# Patient Record
Sex: Female | Born: 1975 | Race: White | Hispanic: No | Marital: Married | Smoking: Former smoker
Health system: Southern US, Community
[De-identification: ages and names within clinical notes are randomized; demographics above are authoritative.]

## PROBLEM LIST (undated history)

## (undated) DIAGNOSIS — M1990 Unspecified osteoarthritis, unspecified site: Secondary | ICD-10-CM

## (undated) DIAGNOSIS — T783XXA Angioneurotic edema, initial encounter: Secondary | ICD-10-CM

## (undated) DIAGNOSIS — K219 Gastro-esophageal reflux disease without esophagitis: Secondary | ICD-10-CM

## (undated) DIAGNOSIS — B009 Herpesviral infection, unspecified: Secondary | ICD-10-CM

## (undated) HISTORY — PX: TUBAL LIGATION: SHX77

## (undated) HISTORY — DX: Angioneurotic edema, initial encounter: T78.3XXA

## (undated) HISTORY — PX: WISDOM TOOTH EXTRACTION: SHX21

---

## 1998-09-23 ENCOUNTER — Other Ambulatory Visit: Admission: RE | Admit: 1998-09-23 | Discharge: 1998-09-23 | Payer: Self-pay | Admitting: Obstetrics and Gynecology

## 2000-05-07 ENCOUNTER — Other Ambulatory Visit: Admission: RE | Admit: 2000-05-07 | Discharge: 2000-05-07 | Payer: Self-pay | Admitting: Obstetrics and Gynecology

## 2000-10-02 ENCOUNTER — Encounter (INDEPENDENT_AMBULATORY_CARE_PROVIDER_SITE_OTHER): Payer: Self-pay | Admitting: Specialist

## 2000-10-02 ENCOUNTER — Ambulatory Visit (HOSPITAL_COMMUNITY): Admission: RE | Admit: 2000-10-02 | Discharge: 2000-10-02 | Payer: Self-pay | Admitting: Gastroenterology

## 2002-03-05 ENCOUNTER — Emergency Department (HOSPITAL_COMMUNITY): Admission: EM | Admit: 2002-03-05 | Discharge: 2002-03-05 | Payer: Self-pay | Admitting: Emergency Medicine

## 2002-08-11 ENCOUNTER — Other Ambulatory Visit: Admission: RE | Admit: 2002-08-11 | Discharge: 2002-08-11 | Payer: Self-pay | Admitting: Obstetrics and Gynecology

## 2003-09-02 ENCOUNTER — Other Ambulatory Visit: Admission: RE | Admit: 2003-09-02 | Discharge: 2003-09-02 | Payer: Self-pay | Admitting: Obstetrics and Gynecology

## 2003-09-24 ENCOUNTER — Encounter: Payer: Self-pay | Admitting: Obstetrics and Gynecology

## 2003-09-24 ENCOUNTER — Ambulatory Visit (HOSPITAL_COMMUNITY): Admission: RE | Admit: 2003-09-24 | Discharge: 2003-09-24 | Payer: Self-pay | Admitting: Obstetrics and Gynecology

## 2004-03-01 ENCOUNTER — Inpatient Hospital Stay (HOSPITAL_COMMUNITY): Admission: AD | Admit: 2004-03-01 | Discharge: 2004-03-04 | Payer: Self-pay | Admitting: Obstetrics and Gynecology

## 2004-03-03 ENCOUNTER — Encounter (INDEPENDENT_AMBULATORY_CARE_PROVIDER_SITE_OTHER): Payer: Self-pay | Admitting: *Deleted

## 2004-05-02 ENCOUNTER — Encounter: Admission: RE | Admit: 2004-05-02 | Discharge: 2004-06-01 | Payer: Self-pay | Admitting: Obstetrics and Gynecology

## 2005-09-24 ENCOUNTER — Other Ambulatory Visit: Admission: RE | Admit: 2005-09-24 | Discharge: 2005-09-24 | Payer: Self-pay | Admitting: Obstetrics and Gynecology

## 2006-01-30 ENCOUNTER — Ambulatory Visit: Payer: Self-pay | Admitting: Pulmonary Disease

## 2006-05-08 ENCOUNTER — Ambulatory Visit: Payer: Self-pay | Admitting: Pulmonary Disease

## 2006-05-31 ENCOUNTER — Ambulatory Visit: Payer: Self-pay | Admitting: Internal Medicine

## 2006-12-06 ENCOUNTER — Ambulatory Visit: Payer: Self-pay | Admitting: Pulmonary Disease

## 2007-11-10 ENCOUNTER — Ambulatory Visit: Payer: Self-pay | Admitting: Pulmonary Disease

## 2007-12-09 DIAGNOSIS — F320 Major depressive disorder, single episode, mild: Secondary | ICD-10-CM

## 2007-12-09 DIAGNOSIS — M549 Dorsalgia, unspecified: Secondary | ICD-10-CM | POA: Insufficient documentation

## 2007-12-09 DIAGNOSIS — K589 Irritable bowel syndrome without diarrhea: Secondary | ICD-10-CM | POA: Insufficient documentation

## 2009-07-01 ENCOUNTER — Ambulatory Visit: Payer: Self-pay | Admitting: Pulmonary Disease

## 2009-07-01 DIAGNOSIS — K5900 Constipation, unspecified: Secondary | ICD-10-CM

## 2009-07-01 DIAGNOSIS — N943 Premenstrual tension syndrome: Secondary | ICD-10-CM | POA: Insufficient documentation

## 2010-08-24 ENCOUNTER — Ambulatory Visit: Payer: Self-pay | Admitting: Pulmonary Disease

## 2010-08-24 ENCOUNTER — Encounter: Payer: Self-pay | Admitting: Pulmonary Disease

## 2010-08-24 LAB — CONVERTED CEMR LAB
ALT: 12 units/L (ref 0–35)
AST: 16 units/L (ref 0–37)
Albumin: 4.1 g/dL (ref 3.5–5.2)
Alkaline Phosphatase: 41 units/L (ref 39–117)
BUN: 9 mg/dL (ref 6–23)
Basophils Absolute: 0 10*3/uL (ref 0.0–0.1)
Basophils Relative: 0.3 % (ref 0.0–3.0)
Bilirubin Urine: NEGATIVE
Bilirubin, Direct: 0.2 mg/dL (ref 0.0–0.3)
CO2: 26 meq/L (ref 19–32)
Calcium: 9.6 mg/dL (ref 8.4–10.5)
Chloride: 103 meq/L (ref 96–112)
Cholesterol: 145 mg/dL (ref 0–200)
Creatinine, Ser: 0.7 mg/dL (ref 0.4–1.2)
Eosinophils Absolute: 0 10*3/uL (ref 0.0–0.7)
Eosinophils Relative: 0.1 % (ref 0.0–5.0)
GFR calc non Af Amer: 105.94 mL/min (ref >60)
Glucose, Bld: 84 mg/dL (ref 70–99)
HCT: 42.2 % (ref 36.0–46.0)
HDL: 45.2 mg/dL (ref >39.00)
Hemoglobin, Urine: NEGATIVE
Hemoglobin: 14.4 g/dL (ref 12.0–15.0)
Ketones, ur: 15 mg/dL
LDL Cholesterol: 90 mg/dL (ref 0–99)
Leukocytes, UA: NEGATIVE
Lymphocytes Relative: 16.4 % (ref 12.0–46.0)
Lymphs Abs: 1.5 10*3/uL (ref 0.7–4.0)
MCHC: 34.1 g/dL (ref 30.0–36.0)
MCV: 93.1 fL (ref 78.0–100.0)
Monocytes Absolute: 0.3 10*3/uL (ref 0.1–1.0)
Monocytes Relative: 3.3 % (ref 3.0–12.0)
Neutro Abs: 7.4 10*3/uL (ref 1.4–7.7)
Neutrophils Relative %: 79.9 % — ABNORMAL HIGH (ref 43.0–77.0)
Nitrite: NEGATIVE
Platelets: 265 10*3/uL (ref 150.0–400.0)
Potassium: 4.2 meq/L (ref 3.5–5.1)
RBC: 4.53 M/uL (ref 3.87–5.11)
RDW: 12.4 % (ref 11.5–14.6)
Sodium: 140 meq/L (ref 135–145)
Specific Gravity, Urine: 1.025 (ref 1.000–1.030)
TSH: 1.08 microintl units/mL (ref 0.35–5.50)
Total Bilirubin: 0.9 mg/dL (ref 0.3–1.2)
Total CHOL/HDL Ratio: 3
Total Protein, Urine: NEGATIVE mg/dL
Total Protein: 7.1 g/dL (ref 6.0–8.3)
Triglycerides: 49 mg/dL (ref 0.0–149.0)
Urine Glucose: NEGATIVE mg/dL
Urobilinogen, UA: 1 (ref 0.0–1.0)
VLDL: 9.8 mg/dL (ref 0.0–40.0)
WBC: 9.3 10*3/uL (ref 4.5–10.5)
pH: 5.5 (ref 5.0–8.0)

## 2010-09-05 ENCOUNTER — Telehealth (INDEPENDENT_AMBULATORY_CARE_PROVIDER_SITE_OTHER): Payer: Self-pay | Admitting: *Deleted

## 2010-09-15 ENCOUNTER — Telehealth (INDEPENDENT_AMBULATORY_CARE_PROVIDER_SITE_OTHER): Payer: Self-pay | Admitting: *Deleted

## 2010-10-24 HISTORY — PX: HERNIA REPAIR: SHX51

## 2010-11-09 ENCOUNTER — Ambulatory Visit
Admission: RE | Admit: 2010-11-09 | Discharge: 2010-11-09 | Payer: Self-pay | Source: Home / Self Care | Admitting: General Surgery

## 2011-01-21 LAB — CONVERTED CEMR LAB
ALT: 15 units/L (ref 0–35)
AST: 18 units/L (ref 0–37)
Albumin: 3.2 g/dL — ABNORMAL LOW (ref 3.5–5.2)
Alkaline Phosphatase: 38 units/L — ABNORMAL LOW (ref 39–117)
BUN: 9 mg/dL (ref 6–23)
Basophils Absolute: 0 10*3/uL (ref 0.0–0.1)
Basophils Relative: 0.7 % (ref 0.0–3.0)
Bilirubin Urine: NEGATIVE
Bilirubin, Direct: 0.1 mg/dL (ref 0.0–0.3)
CO2: 28 meq/L (ref 19–32)
Calcium: 9.3 mg/dL (ref 8.4–10.5)
Chloride: 106 meq/L (ref 96–112)
Cholesterol: 156 mg/dL (ref 0–200)
Creatinine, Ser: 0.8 mg/dL (ref 0.4–1.2)
Eosinophils Absolute: 0 10*3/uL (ref 0.0–0.7)
Eosinophils Relative: 0.9 % (ref 0.0–5.0)
GFR calc non Af Amer: 86.91 mL/min (ref >60)
Glucose, Bld: 90 mg/dL (ref 70–99)
HCT: 40.6 % (ref 36.0–46.0)
HDL: 52.3 mg/dL (ref >39.00)
Hemoglobin, Urine: NEGATIVE
Hemoglobin: 14.1 g/dL (ref 12.0–15.0)
Ketones, ur: NEGATIVE mg/dL
LDL Cholesterol: 84 mg/dL (ref 0–99)
Leukocytes, UA: NEGATIVE
Lymphocytes Relative: 29.2 % (ref 12.0–46.0)
Lymphs Abs: 1.5 10*3/uL (ref 0.7–4.0)
MCHC: 34.7 g/dL (ref 30.0–36.0)
MCV: 90.9 fL (ref 78.0–100.0)
Monocytes Absolute: 0.4 10*3/uL (ref 0.1–1.0)
Monocytes Relative: 7.3 % (ref 3.0–12.0)
Neutro Abs: 3.2 10*3/uL (ref 1.4–7.7)
Neutrophils Relative %: 61.9 % (ref 43.0–77.0)
Nitrite: NEGATIVE
Platelets: 252 10*3/uL (ref 150.0–400.0)
Potassium: 4.5 meq/L (ref 3.5–5.1)
RBC: 4.47 M/uL (ref 3.87–5.11)
RDW: 11.7 % (ref 11.5–14.6)
Sodium: 139 meq/L (ref 135–145)
Specific Gravity, Urine: 1.025 (ref 1.000–1.030)
TSH: 1.16 microintl units/mL (ref 0.35–5.50)
Total Bilirubin: 0.8 mg/dL (ref 0.3–1.2)
Total CHOL/HDL Ratio: 3
Total Protein, Urine: NEGATIVE mg/dL
Total Protein: 6.6 g/dL (ref 6.0–8.3)
Triglycerides: 101 mg/dL (ref 0.0–149.0)
Urine Glucose: NEGATIVE mg/dL
Urobilinogen, UA: 1 (ref 0.0–1.0)
VLDL: 20.2 mg/dL (ref 0.0–40.0)
WBC: 5.1 10*3/uL (ref 4.5–10.5)
pH: 6 (ref 5.0–8.0)

## 2011-01-25 NOTE — Progress Notes (Signed)
Summary: medication question  Phone Note Call from Patient Call back at Home Phone [phone]   Caller: Patient Call For: nadel Summary of Call: Pt states she is taking zovirax 5% oint for fever blisters, wants to know if there is some type of pill form she can take, re: she gets fever blisters quite often and the ointment "tastes nasty." Pls advise.//[facility] Initial call taken by: Darletta Moll,  September 05, 2010 1:57 PM  Follow-up for Phone Call        Called and spoke with pt.  She states that she would like a substitute for zovirax cream.  She states that she gets fever blisters "all the time" and the cream has a bad taste, would like something in a pill form.  Pls advise, thanks! Follow-up by: Vernie Murders,  September 05, 2010 2:13 PM  Additional Follow-up for Phone Call Additional follow up Details #1::        per SN---ok for pt to have acyclovir 400mg   #100  1 by mouth for times daily as needed for fever blisters  refill as needed.  thanks Randell Loop CMA  September 05, 2010 3:17 PM     Additional Follow-up for Phone Call Additional follow up Details #2::    Spoke with pt and advised her of the above recs per SN.  Pt verbalzied understanding.  Rx was sent to [facility]. Follow-up by: Vernie Murders,  September 05, 2010 3:28 PM  New/Updated Medications: ACYCLOVIR 400 MG TABS (ACYCLOVIR) 1 by mouth four times a day as needed Prescriptions: ACYCLOVIR 400 MG TABS (ACYCLOVIR) 1 by mouth four times a day as needed  #100 x 11   Entered by:   Vernie Murders   Authorized by:   Michele Mcalpine MD   Signed by:   Vernie Murders on 09/05/2010   Method used:   [facility]  [address] [phone][facility]       [address] [place], [place]  [address]       Ph: [phone] or [phone]       Fax: [phone]   RxID:   [id number]

## 2011-01-25 NOTE — Assessment & Plan Note (Signed)
Summary: cpx/jd   CC:  Yearly CPX....  History of Present Illness: 35 y/o WF here for a follow up visit and CPX...    ~  August 24, 2010:  she has been doing well overall but still smoking & "wants to quit"; off Zoloft & wants to try Wellbutrin; still constipated and we discussed laxative/ stool softener Rx; notes freq fever blisters and wants salve...    Current Problems:   Health Maintenance:  we discussed smoking cessation, & rec ASA 81mg /d...  ~  GYN= Dr. Della Goo & she is up to date on PAP etc...  CIGARETTE SMOKER (ICD-305.1) - smokes 1/2 ppd & she's been told to quit by GYN- had to stop the YAZ Rx at age 47...  denies cough, phlegm, SOB, CP, etc...  ~  7/10:  discussed smoking cessation strategies including cessation programs, counselling, nicotine replacement, and Chantix receptor blockade... she declined help.  ~  9/11:  now states that she has decided to quit & want to try Wellbutrin Rx & E-cigs, +rec to consider counselling (she declines), Nicotine relacement, etc...  IRRITABLE BOWEL SYNDROME (ICD-564.1), & CONSTIPATION (ICD-564.00) - Hx IBS w/ constipation predominent form... states BMs once weekly- firm to hard, sometimes w/ liq stool to follow... denies abd pain, N/V, blood seen, etc... prev eval by [name] w/ colonoscopy 2001= neg...  ~  7/10:  we discussed Rx w/ Metamucil once or twice daily, fluids, etc > no better she says.  ~  9/11:  advised to seek help of GI but she declines appt> therefore try combination MIRALAX, SENAKOT-S, & she likes cereal fiber bars...  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 she will discuss further w/ DrHarvett soon...  BACK PAIN, LUMBAR (ICD-724.2) - she uses ETODOLAC 400mg  Bid Prn...  ANXIETY (ICD-300.00), & DEPRESSION (ICD-311) - on ALPRAZOLAM 0.5mg  Tid Prn... she notes that she uses the Xanax mostly around  her menses... prev on Zoloft 50mg /d but she stopped this on her own & wants to try Wellbutrin (Zyban) to help nerves and for smoking cessation help> start BUPROPION 100mg  & titrate up to Bid.  Hx of ANGIONEUROTIC EDEMA NOT ELSEWHERE CLASSIFIED (ICD-995.1) - evaluated in 1997 for urticaria & angioedema of ?etiology... nothing ever determined and symptoms resolved without recurrence... allergy testing by DrESL was neg- IgE level = 11...    Preventive Screening-Counseling & Management  Alcohol-Tobacco     Smoking Status: current     Packs/Day: .50  Allergies (verified): No Known Drug Allergies  Comments:  Nurse/Medical Assistant: The patient's medications and allergies were reviewed with the patient and were updated in the Medication and Allergy Lists.  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 mother alive age 102 father alive age 46---Wayne Vincente Poli 1 sibling alive age 41 grandmother hx of DM  Social History: Reviewed history from 07/01/2009 and no changes required. current smoker--1/2 PPD exercises 2 times per week caffeine use---occ no alcohol use married 2 children Packs/Day:  .50  Review of Systems       The patient complains of depression and anxiety.  The patient denies fever, chills, sweats, anorexia, fatigue, weakness, malaise, weight loss, sleep disorder, blurring, diplopia,  eye irritation, eye discharge, vision loss, eye pain, photophobia, earache, ear discharge, tinnitus, decreased hearing, nasal congestion, nosebleeds, sore throat, hoarseness, chest pain, palpitations, syncope, dyspnea on exertion, orthopnea, PND,  peripheral edema, cough, dyspnea at rest, excessive sputum, hemoptysis, wheezing, pleurisy, nausea, vomiting, diarrhea, constipation, change in bowel habits, abdominal pain, melena, hematochezia, jaundice, gas/bloating, indigestion/heartburn, dysphagia, odynophagia, dysuria, hematuria, urinary frequency, urinary hesitancy, nocturia, incontinence, back pain, joint pain, joint swelling, muscle cramps, muscle weakness, stiffness, arthritis, sciatica, restless legs, leg pain at night, leg pain with exertion, rash, itching, dryness, suspicious lesions, paralysis, paresthesias, seizures, tremors, vertigo, transient blindness, frequent falls, frequent headaches, difficulty walking, memory loss, confusion, cold intolerance, heat intolerance, polydipsia, polyphagia, polyuria, unusual weight change, abnormal bruising, bleeding, enlarged lymph nodes, urticaria, allergic rash, hay fever, and recurrent infections.    Vital Signs:  Patient profile:   35 year old female Height:      65 inches Weight:      162.13 pounds BMI:     27.08 O2 Sat:      98 % on Room air Temp:     98.9 degrees F oral Pulse rate:   76 / minute BP sitting:   126 / 84  (right arm) Cuff size:   regular  Vitals Entered By: Randell Loop CMA (August 24, 2010 11:23 AM)  O2 Sat at Rest %:  98 O2 Flow:  Room air CC: Yearly CPX... Is Patient Diabetic? No Pain Assessment Patient in pain? no      Comments meds updated today with pt   Physical Exam  Additional Exam:  WD, WN, 35 y/o WF in NAD... GENERAL:  Alert & oriented; pleasant & cooperative... HEENT:  /AT, EOM-wnl, PERRLA, Fundi-benign, EACs-clear, TMs-wnl, NOSE-clear, THROAT-clear & wnl. NECK:  Supple w/ full ROM; no JVD; normal carotid impulses w/o bruits; no thyromegaly or nodules palpated; no lymphadenopathy. CHEST:  Clear to P & A; without wheezes/ rales/ or rhonchi. HEART:  Regular Rhythm; without murmurs/ rubs/ or gallops. ABDOMEN:  Soft & nontender; normal bowel  sounds; no organomegaly or masses detected. EXT: without deformities or arthritic changes; no varicose veins/ venous insuffic/ or edema. NEURO:  CN's intact; motor testing normal; sensory testing normal; gait normal & balance OK. DERM:  No lesions noted; no rash etc...    CXR  Procedure date:  08/24/2010  Findings:      CHEST - 2 VIEW Comparison: 07/01/2009.   Findings: The cardiac silhouette is normal size and shape. The lungs are well aerated and free of infiltrates. No pleural abnormality is evident. There is subtle slight scoliosis convexity to the right.   IMPRESSION: No acute or active cardiopulmonary process is identified.   Read By:  Crawford Givens,  M.D.   EKG  Procedure date:  08/24/2010  Findings:      Normal sinus rhythm with rate of:  72/ min... Tracing is WNL, NAD...  SN   MISC. Report  Procedure date:  08/24/2010  Findings:      BMP (METABOL)   Sodium                    140 mEq/L                   135-145   Potassium                 4.2 mEq/L                   3.5-5.1   Chloride  103 mEq/L                   96-112   Carbon Dioxide            26 mEq/L                    19-32   Glucose                   84 mg/dL                    04-54   BUN                       9 mg/dL                     0-98   Creatinine                0.7 mg/dL                   1.1-9.1   Calcium                   9.6 mg/dL                   4.7-82.9   GFR                       105.94 mL/min               >60  Hepatic/Liver Function Panel (HEPATIC)   Total Bilirubin           0.9 mg/dL                   5.6-2.1   Direct Bilirubin          0.2 mg/dL                   3.0-8.6   Alkaline Phosphatase      41 U/L                      39-117   AST                       16 U/L                      0-37   ALT                       12 U/L                      0-35   Total Protein             7.1 g/dL                    5.7-8.4   Albumin                   4.1 g/dL                     6.9-6.2  CBC Platelet w/Diff (CBCD)   White Cell Count          9.3 K/uL                    4.5-10.5  Red Cell Count            4.53 Mil/uL                 3.87-5.11   Hemoglobin                14.4 g/dL                   16.1-09.6   Hematocrit                42.2 %                      36.0-46.0   MCV                       93.1 fl                     78.0-100.0   Platelet Count            265.0 K/uL                  150.0-400.0   Neutrophil %         [H]  79.9 %                      43.0-77.0   Lymphocyte %              16.4 %                      12.0-46.0   Monocyte %                3.3 %                       3.0-12.0   Eosinophils%              0.1 %                       0.0-5.0   Basophils %               0.3 %                       0.0-3.0  Comments:      Lipid Panel (LIPID)   Cholesterol               145 mg/dL                   0-454   Triglycerides             49.0 mg/dL                  0.9-811.9   HDL                       14.78 mg/dL                 >29.56   LDL Cholesterol           90 mg/dL                    2-13  TSH (TSH)   FastTSH                   1.08 uIU/mL  0.35-5.50   UDip w/Micro (URINE)   Color                     YELLOW   Clarity                   CLOUDY                      Clear   Specific Gravity          1.025                       1.000 - 1.030   Urine Ph                  5.5                         5.0-8.0   Protein                   NEGATIVE                    Negative   Urine Glucose             NEGATIVE                    Negative   Ketones                   15                          Negative   Urine Bilirubin           NEGATIVE                    Negative   Blood                     NEGATIVE                    Negative   Urobilinogen              1.0                         0.0 - 1.0   Leukocyte Esterace        NEGATIVE                    Negative   Nitrite                   NEGATIVE                     Negative   Urine WBC                 0-2/hpf                     0-2/hpf   Urine RBC                 0-2/hpf                     0-2/hpf   Urine Mucus               Presence of  None   Urine Epith               Few(5-10/hpf)               Rare(0-4/hpf)   Urine Bacteria            Rare(<10/hpf)               None   Impression & Recommendations:  Problem # 1:  PHYSICAL EXAMINATION (ICD-V70.0)  Orders: T-2 View CXR (71020TC) 12 Lead EKG (12 Lead EKG) TLB-BMP (Basic Metabolic Panel-BMET) (80048-METABOL) TLB-Hepatic/Liver Function Pnl (80076-HEPATIC) TLB-CBC Platelet - w/Differential (85025-CBCD) TLB-Lipid Panel (80061-LIPID) TLB-TSH (Thyroid Stimulating Hormone) (84443-TSH) TLB-Udip w/ Micro (81001-URINE)  Problem # 2:  CIGARETTE SMOKER (ICD-305.1) We discussed smoking cessation strategies>  she wants to try Wellbutrin, +E-cigs... we also discussed nicotine replacement therapy, counseeling, Chantix, etc...  Problem # 3:  CONSTIPATION (ICD-564.00) She has persistant constip going  ~1 per week... we discussed fiber, bulk, softeners, etc... rec trial Miralax Bid + Senakot-S 2Qhs... otherw will need GI f/u for management issues... Her updated medication list for this problem includes:    Miralax Powd (Polyethylene glycol 3350) ..... Mix 1 capful in water two times a day...    Senokot S 8.6-50 Mg Tabs (Sennosides-docusate sodium) .Marland Kitchen... Take 2 tabs by mouth at bedtime...  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 Her updated medication list for this problem includes:    Etodolac 400 Mg Tabs (Etodolac) .Marland Kitchen... Take 1 tab by mouth two times a day as needed for arthritis pain  Problem # 5:  ANXIETY (ICD-300.00) She has anxiety & Depression... wants to switch Zolfoft for Wellbutrin- OK, and refill Alprazolam for her panic attacks... The following medications were removed from the medication list:    Zoloft 50 Mg Tabs (Sertraline hcl)  .Marland Kitchen... Take 1 tablet by mouth once a day Her updated medication list for this problem includes:    Xanax 0.5 Mg Tabs (Alprazolam) .Marland Kitchen... Take 1/2-1 tablet by mouth three times a day as needed for nerves...    Bupropion Hcl 100 Mg Xr12h-tab (Bupropion hcl) .Marland Kitchen... Take 1 tab by mouth two times a day as directed...  Problem # 6:  OTHER MEDICAL ISSUES AS NOTED>>>  Complete Medication List: 1)  Etodolac 400 Mg Tabs (Etodolac) .... Take 1 tab by mouth two times a day as needed for arthritis pain 2)  Miralax Powd (Polyethylene glycol 3350) .... Mix 1 capful in water two times a day... 3)  Senokot S 8.6-50 Mg Tabs (Sennosides-docusate sodium) .... Take 2 tabs by mouth at bedtime.Marland KitchenMarland Kitchen 4)  Xanax 0.5 Mg Tabs (Alprazolam) .... Take 1/2-1 tablet by mouth three times a day as needed for nerves.Marland KitchenMarland Kitchen 5)  Bupropion Hcl 100 Mg Xr12h-tab (Bupropion hcl) .... Take 1 tab by mouth two times a day as directed... 6)  Zovirax 5 % Oint (Acyclovir) .... Apply to lesions every 3-4 h as directed...  Patient Instructions: 1)  Today we updated your med list- see below.... 2)  We wrote new perscriptions for generic Wellbutrin to take once or twice daily as we discussed, and the Zovirax ontment to use as neede dfor the lip sores... 3)  Today we did your follow up CXR, EKG, and FASTING blood work... please call the "phone tree" in a few days for your lab results.Marland KitchenMarland Kitchen 4)  Call for any problems.Marland KitchenMarland Kitchen 5)  Please schedule a follow-up appointment in 1 year, sooner as needed. Prescriptions: ZOVIRAX 5 % OINT (ACYCLOVIR)  apply to lesions every 3-4 H as directed...  #1 tube x prn   Entered and Authorized by:   Michele Mcalpine MD   Signed by:   Michele Mcalpine MD on 08/24/2010   Method used:   Print then Give to Patient   RxID:   [id number] BUPROPION HCL 100 MG XR12H-TAB (BUPROPION HCL) take 1 tab by mouth two times a day as directed...  #60 x 6   Entered and Authorized by:   Michele Mcalpine MD   Signed by:   Michele Mcalpine MD on 08/24/2010    Method used:   Print then Give to Patient   RxID:   [id number] XANAX 0.5 MG TABS (ALPRAZOLAM) Take 1/2-1 tablet by mouth three times a day as needed for nerves...  #90 x 6   Entered and Authorized by:   Michele Mcalpine MD   Signed by:   Michele Mcalpine MD on 08/24/2010   Method used:   Print then Give to Patient   RxID:   [id number]

## 2011-01-25 NOTE — Progress Notes (Signed)
Summary: change from wellbutrin back to zoloft -   Phone Note Call from Patient   Caller: Patient Call For: nadel Summary of Call: pt is unable to take welbutrin.  Initial call taken by: Rickard Patience,  September 15, 2010 1:29 PM  Follow-up for Phone Call        Pt states wellbutrin caused her to have bad mood swings. She tried it for 3 weeks. Pt wants to go back on Zoloft 50mg   and take that with some of the OTC options to help her stop smoking. Please advise if ok to send in rx for zoloft. Carron Curie CMA  September 15, 2010 3:07 PM  [facility]   Additional Follow-up for Phone Call Additional follow up Details #1::        per SN----ok to cancel the refills of the wellbutrin and call in zoloft 50mg    1 by mouth once daily #30  with 5 refills. Randell Loop CMA  September 15, 2010 4:20 PM   [facility] TCB.  med changed on med list.  does pt need refills? Boone Master CNA/MA  September 15, 2010 4:26 PM    New Allergies: [facility] Additional Follow-up for Phone Call Additional follow up Details #2::    returning nurse call reguarding med change. Follow-up by: Rickard Patience,  September 18, 2010 8:32 AM  Additional Follow-up for Phone Call Additional follow up Details #3:: Details for Additional Follow-up Action Taken: called and spoke with pt.  pt aware SN ok'd to change back to Zoloft 50mg  once daily.  Pt states she does need rx [facility].  Rx sent.  Aundra Millet Reynolds LPN  September 18, 2010 10:25 AM   New/Updated Medications: ZOLOFT 50 MG TABS (SERTRALINE HCL) Take 1 tablet by mouth once a day New Allergies: WELLBUTRINPrescriptions: ZOLOFT 50 MG TABS (SERTRALINE HCL) Take 1 tablet by mouth once a day  #30 x 5   Entered by:   Arman Filter LPN   Authorized by:   Michele Mcalpine MD   Signed by:   Arman Filter LPN on 21/30/8657   Method used:   [facility]  [address] [phone][facility]ail[facility]       [address] [place], [place]   [address]       Ph: [phone] or [phone]       Fax: [phone]   RxID:   [id number]

## 2011-03-01 ENCOUNTER — Telehealth (INDEPENDENT_AMBULATORY_CARE_PROVIDER_SITE_OTHER): Payer: Self-pay | Admitting: *Deleted

## 2011-03-06 LAB — CBC
HCT: 39.7 % (ref 36.0–46.0)
Hemoglobin: 13.9 g/dL (ref 12.0–15.0)
MCH: 30.9 pg (ref 26.0–34.0)
MCHC: 35 g/dL (ref 30.0–36.0)
MCV: 88.2 fL (ref 78.0–100.0)
Platelets: 316 10*3/uL (ref 150–400)
RBC: 4.5 MIL/uL (ref 3.87–5.11)
RDW: 12.2 % (ref 11.5–15.5)
WBC: 7.8 10*3/uL (ref 4.0–10.5)

## 2011-03-06 LAB — DIFFERENTIAL
Basophils Relative: 0 % (ref 0–1)
Eosinophils Absolute: 0.1 10*3/uL (ref 0.0–0.7)
Eosinophils Relative: 1 % (ref 0–5)
Lymphs Abs: 1.9 10*3/uL (ref 0.7–4.0)
Monocytes Relative: 6 % (ref 3–12)
Neutrophils Relative %: 68 % (ref 43–77)

## 2011-03-06 LAB — BASIC METABOLIC PANEL
BUN: 8 mg/dL (ref 6–23)
CO2: 25 mEq/L (ref 19–32)
Calcium: 8.9 mg/dL (ref 8.4–10.5)
Chloride: 107 mEq/L (ref 96–112)
Creatinine, Ser: 0.77 mg/dL (ref 0.4–1.2)
GFR calc Af Amer: >60 mL/min (ref >60)
GFR calc non Af Amer: >60 mL/min (ref >60)
Glucose, Bld: 84 mg/dL (ref 70–99)
Potassium: 3.7 mEq/L (ref 3.5–5.1)
Sodium: 137 mEq/L (ref 135–145)

## 2011-03-06 NOTE — Progress Notes (Signed)
Summary: refill on Etodolac<<<done  Phone Note Call from Patient   Caller: Patient Call For: nadel Summary of Call: Patient phoned stated that she needs a refill for her Etodolac 400 mg she called the pharmacy and her prescription has expired. [facility] [place]. She really needs this today she stated that her back is really hurting today. She can be reached at [phone] Initial call taken by: Vedia Coffer,  March 01, 2011 8:42 AM  Follow-up for Phone Call        Patient is aware RX sent to her pharmacy.Michel Bickers CMA  March 01, 2011 9:13 AM

## 2011-03-23 ENCOUNTER — Other Ambulatory Visit: Payer: Self-pay | Admitting: *Deleted

## 2011-03-23 MED ORDER — ALPRAZOLAM 0.5 MG PO TABS: ORAL_TABLET | ORAL | Status: DC

## 2011-05-11 NOTE — Assessment & Plan Note (Signed)
[facility]                             PULMONARY OFFICE NOTE   ARIEN, Mia Hunter                      MRN:          [id number]  DATE:12/06/2006                            DOB:          March 31, 1976    HISTORY OF PRESENT ILLNESS:  The patient is a 35 year old white female  patient of Dr. Jodelle Green, with a known history of underlying depression  and anxiety, returns today for a routine office visit.  The patient has  previously been started on Effexor XR in February of this year.  The  patient reports that she has waxed and waned with increased feelings of  anxiety, mild depressions and feelings of insecurity with low self-  esteem.  The patient has been on multiple antidepressants in the past,  including Zoloft, Wellbutrin, and now most recently Effexor.  She  reports that she does well for a period of time, and then periodically  forgets to take and/or slowly titrates herself off due to either  difficulties affording the prescriptions or feelings that she wants to  try different medications because she feels they are not working  effectively for her.  Most recently the patient reports that she has not  been able to lose any weight, although she reports she is not exercising  or changing her diet.  She also complains that she gets very irritated  very easily.  The patient denies any suicidal or homicidal ideations.  She reports she does not exercise on a regular basis.  She does not have  any difficulty sleeping, headache, visual changes, chest pain, shortness  of breath or leg swelling.   PAST MEDICAL HISTORY:  Reviewed.   CURRENT MEDICATIONS:  Reviewed.   PHYSICAL EXAMINATION:  The patient is a pleasant female in no acute  distress.  She is afebrile, stable vital signs.  HEENT:  Unremarkable.  NECK:  Supple without adenopathy.  No thyromegaly or nodules noted.  LUNGS:  Lung sounds are clear.  CARDIAC:  Regular rate and rhythm.  ABDOMEN:  Soft  and benign.  EXTREMITIES:  Warm without any edema.   IMPRESSION AND PLAN:  Depression and anxiety.  Once again I have  emphasized to the patient the importance of stress-reducing exercises  and self-image exercises.  The patient has been once again been offered  counseling referral; however, refuses.  The patient does not wish to  stay on Effexor, has asked to be switched back to Zoloft.  Therefore, we  will go back to Zoloft 50 mg daily.  Also, there is a generic available  that may also help her financially.  The patient will recheck here in 4  to 6 weeks, or sooner is needed.  I will not do any lab work at this  time.  The patient had lab work earlier this year which was all unremarkable,  with a CBC, BMET and TSH.      Rubye Oaks, NP  Electronically Signed      Lonzo Cloud. Kriste Basque, MD  Electronically Signed   TP/MedQ  DD: 12/06/2006  DT: 12/06/2006  Job #: [id number]

## 2011-05-11 NOTE — Discharge Summary (Signed)
NAME:  Mia Hunter, Mia Hunter                         ACCOUNT NO.:  [id number]   MEDICAL RECORD NO.:  [id number]                   PATIENT TYPE:  INP   LOCATION:  9102                                 FACILITY:  [facility]   PHYSICIAN:  Malachi Pro. Ambrose Mantle, M.D.              DATE OF BIRTH:  08-30-1976   DATE OF ADMISSION:  03/01/2004  DATE OF DISCHARGE:                                 DISCHARGE SUMMARY   HOSPITAL COURSE:  A 35 year old white female para 1-0-3-1 gravida 5 at 41  and six-sevenths weeks gestation with an [facility] of February 24, 2004 by a 12-week  ultrasound.  Presented to labor and delivery with contractions every 3-5  minutes.  No leakage of fluid, no vaginal bleeding, good fetal movement.  Prenatal care complicated by history of depression, stable on Zoloft, and  positive smoker, decreased with pregnancy.  Prenatal labs:  Blood group and  type A positive, negative antibody, RPR nonreactive, rubella immune,  hepatitis B surface antigen negative, HIV negative, GC and chlamydia  negative, triple screen normal, group B strep negative, one-hour Glucola  100.  Past obstetric history:  Early abortion x2, spontaneous abortion x1,  spontaneous vaginal delivery in 1996, 7-pound 4-ounce infant.  GYN history:  History of HPV, none recently.  Past medical history:  History of  depression.  Surgical history:  Negative.  Social history:  Smoker.  No  known allergies.  Medications:  Zoloft.  On admission the patient was  afebrile with normal vital signs.  Heart normal, lungs clear, fetal heart  reassuring.  Abdomen soft, nontender.  Cervix 3-4 cm, 80%, vertex at a -2.  The patient received an epidural.  The patient was noted to have variable  decelerations down to fetal heart rate of 70-80 with good recovery.  By  10:25 p.m. the cervix was 8 cm.  Artificial rupture of the membranes  produced thick meconium-stained fluid.  Amnioinfusion was begun and fetal  scalp electrode placed.  After repositioning the  variables improved to a low  of 115 with good accelerations.  By 11:15 p.m. the cervix was 9 cm.  The  patient began to again have deep variable decelerations to 80-90, recovering  after contractions.  She was found to be fully dilated, vertex at a 0  station.  The patient was able to push the baby down to a +3 station with  Dr. Senaida Ores present.  At this point the variable decelerations had a  slower recovery so the patient was counseled and agreed to assisted vaginal  delivery.  A MityVac vacuum was applied to the vertex.  Head delivered to  crowning in three pulls with one pop-off.  The patient then pushed out the  remainder of the head.  Tight nuchal cord x2 was noted that was nonreducible  so clamped and cut.  Remaining body delivered after the baby was DeLee  suctioned without difficulty.  The baby was floppy  and handed to the  pediatrician for evaluation.  Apgars were 4 at one, 7 at five, and 9 at ten  minutes.  The baby responded to stimulation but had grunting and was taken  to the nursery for observation.  Weight was 8 pounds 2 ounces.  Viable female  infant.  Placenta delivered spontaneously.  Second degree laceration  repaired with 2-0 Vicryl.  Cervix and rectum intact.  Blood loss about 400  mL.  Cord pH 7.12.  Dr. Senaida Ores was in attendance.  The patient requested  tubal ligation.  She underwent tubal ligation on postpartum day #1 and was  ready for discharge on postpartum day #2 and postoperative day #1.  Labs  showed initial hemoglobin of 12.5; hematocrit 37.8; white count 10,700;  platelet count 321,000.  Follow-up hemoglobin 11.5.  RPR was nonreactive.   DIAGNOSIS:  Intrauterine pregnancy at 40+ weeks, delivered vertex.   OPERATION:  Vacuum-assisted vaginal delivery, repair of second degree  perineal laceration.   FINAL CONDITION:  Improved.   INSTRUCTIONS:  Include our regular discharge instruction booklet.  Percocet  5/325 #20 tablets one q.4-6h. as needed for  pain is given at discharge.  The  patient is advised to return to the office in 10-14 days for follow-up  examination.                                               Malachi Pro. Ambrose Mantle, M.D.    TFH/MEDQ  D:  03/04/2004  T:  03/04/2004  Job:  [id number]

## 2011-05-11 NOTE — Op Note (Signed)
[place]  Patient:    Mia Hunter, Mia Hunter                        MRN: [id number] Proc. Date: 10/02/00 Adm. Date:  [id number] Attending:  Charna Elizabeth                           Operative Report  DATE OF BIRTH:  06-21-1976  REFERRING PHYSICIAN:  Teena Irani. Arlyce Dice, M.D.  PROCEDURE PERFORMED:  Colonoscopy with biopsies.  ENDOSCOPIST:  Anselmo Rod, M.D.  INSTRUMENT USED:  Olympus video colonoscope.  INDICATIONS FOR PROCEDURE:  Abdominal pain, history of severe constipation and blood in stool in a 35 year old white female.  Rule out colonic polyps, masses, etc.  PREPROCEDURE PREPARATION:  Informed consent was procured from the patient. The patient was fasted for eight hours prior to the procedure and prepped with a bottle of magnesium citrate and a gallon of NuLytely the night prior to the procedure.  PREPROCEDURE PHYSICAL:  The patient had stable vital signs.  Neck supple. Chest clear to auscultation.  S1, S2 regular.  Abdomen soft with normal abdominal bowel sounds.  DESCRIPTION OF PROCEDURE:  The patient was placed in the left lateral decubitus position and sedated with 80 mg of Demerol and 8 mg of Versed intravenously.  Once the patient was adequately sedated and maintained on low-flow oxygen and continuous cardiac monitoring, the Olympus video colonoscope was advanced from the rectum to the cecum without difficulty. Except for patchy erythema in the rectum from 0 to 5 cm, no other abnormalities were seen, no masses, polyps, erosions, ulcerations or diverticula were present.  The patchy erythema in the rectum was biopsied for pathology.  IMPRESSION:  Essentially healthy-appearing colon except for slight erythema in the distal rectum.  RECOMMENDATIONS: 1. Await pathology results. 2. Zoloft 100 mg p.o. q.d. 3. Increase fluid and fiber in the diet. 4. Colace 100 to 200 mg at bedtime as stool softener. 5. Outpatient follow-up on a  p.r.n. basis. RECOMMENDATIONS:DD:  10/02/00 TD:  10/03/00 Job: [id number]

## 2011-05-11 NOTE — Op Note (Signed)
NAME:  ARTHA, STAVROS                         ACCOUNT NO.:  [id number]   MEDICAL RECORD NO.:  [id number]                   PATIENT TYPE:  INP   LOCATION:  9102                                 FACILITY:  [facility]   PHYSICIAN:  Huel Cote, M.D.              DATE OF BIRTH:  06-Jun-1976   DATE OF PROCEDURE:  03/03/2004  DATE OF DISCHARGE:  03/04/2004                                 OPERATIVE REPORT   PREOPERATIVE DIAGNOSES:  1. Status post normal spontaneous vaginal delivery.  2. Desires sterility.   POSTOPERATIVE DIAGNOSES:  1. Status post normal spontaneous vaginal delivery.  2. Desires sterility.   PROCEDURE:  Postpartum tubal ligation.   SURGEON:  Dr. Huel Cote.   ANESTHESIA:  Epidural.   FINDINGS:  Normal tubes bilaterally.  The uterine fundus is well contracted  with umbilicus month four.   FLUIDS:  Estimated blood loss minimal.   DESCRIPTION OF PROCEDURE:  The patient was taken to the operating room where  epidural anesthesia was found to be adequate by Allis clamp test.  She was  then prepped and draped in the normal sterile fashion in the dorsal supine  position.  A small infraumbilical incision was made with a scalpel and  carried through to the underlying layer of fascia by sharp dissection.  The  fascia was then opened and the peritoneal cavity entered bluntly.  Appendectomy retractors were then placed within the incision and the bowel  packed away with several moist 4 x 18 sponges with a hemostat on the end of  them.  Uterine fundus had been very well contracted and was about 4 cm below  the umbilicus.  However, the tubes were clearly visualized, grasped with a  Babcock clamp, and traced out to their fimbriated end.  Each tube was  elevated through the incision, a small opening made in the mesosalpinx with  Bovie cautery, and the tube was then tied off with two free ties of 0 plain  on each side.  The tubal segments were amputated, handed off to pathology,  and the remaining ostia were cauterized with Bovie cautery.  The free  pedicle was carefully inspected and found to be hemostatic bilaterally.  With each tube successfully visualized and each pedicle clear of bleeding,  these were returned to the abdomen.  Laparotomy sponges removed, and the  patient's fascia was closed with 0 Vicryl in a running fashion.  The skin  was closed with 3-0 Vicryl in a subcuticular stitch.  Sponge, lap, and  needle counts were correct x 2, and the patient was taken to the recovery  room in stable condition.                                               Huel Cote, M.D.  KR/MEDQ  D:  03/03/2004  T:  03/04/2004  Job:  [id number]

## 2011-09-03 ENCOUNTER — Ambulatory Visit (INDEPENDENT_AMBULATORY_CARE_PROVIDER_SITE_OTHER): Payer: BC Managed Care – PPO | Admitting: Pulmonary Disease

## 2011-09-03 ENCOUNTER — Encounter: Payer: Self-pay | Admitting: Pulmonary Disease

## 2011-09-03 ENCOUNTER — Ambulatory Visit (INDEPENDENT_AMBULATORY_CARE_PROVIDER_SITE_OTHER)
Admission: RE | Admit: 2011-09-03 | Discharge: 2011-09-03 | Disposition: A | Discharge status: Home / Self Care | Payer: BC Managed Care – PPO | Source: Ambulatory Visit | Attending: Pulmonary Disease | Admitting: Pulmonary Disease

## 2011-09-03 ENCOUNTER — Other Ambulatory Visit (INDEPENDENT_AMBULATORY_CARE_PROVIDER_SITE_OTHER): Payer: BC Managed Care – PPO

## 2011-09-03 ENCOUNTER — Other Ambulatory Visit: Payer: Self-pay | Admitting: Pulmonary Disease

## 2011-09-03 VITALS — BP 120/88 | HR 69 | Temp 98.3°F | Ht 65.0 in | Wt 167.0 lb

## 2011-09-03 DIAGNOSIS — F411 Generalized anxiety disorder: Secondary | ICD-10-CM

## 2011-09-03 DIAGNOSIS — F329 Major depressive disorder, single episode, unspecified: Secondary | ICD-10-CM

## 2011-09-03 DIAGNOSIS — Z0000 Encounter for general adult medical examination without abnormal findings: Secondary | ICD-10-CM

## 2011-09-03 DIAGNOSIS — M545 Low back pain: Secondary | ICD-10-CM

## 2011-09-03 DIAGNOSIS — N943 Premenstrual tension syndrome: Secondary | ICD-10-CM

## 2011-09-03 DIAGNOSIS — K589 Irritable bowel syndrome without diarrhea: Secondary | ICD-10-CM

## 2011-09-03 DIAGNOSIS — K5900 Constipation, unspecified: Secondary | ICD-10-CM

## 2011-09-03 LAB — HEPATIC FUNCTION PANEL
ALT: 13 U/L (ref 0–35)
AST: 16 U/L (ref 0–37)
Alkaline Phosphatase: 36 U/L — ABNORMAL LOW (ref 39–117)
Bilirubin, Direct: 0.1 mg/dL (ref 0.0–0.3)
Total Bilirubin: 0.8 mg/dL (ref 0.3–1.2)
Total Protein: 7.3 g/dL (ref 6.0–8.3)

## 2011-09-03 LAB — URINALYSIS, ROUTINE W REFLEX MICROSCOPIC
Bilirubin Urine: NEGATIVE
Nitrite: NEGATIVE
Total Protein, Urine: NEGATIVE
Urobilinogen, UA: 0.2 (ref 0.0–1.0)

## 2011-09-03 LAB — CBC WITH DIFFERENTIAL/PLATELET
Basophils Absolute: 0 10*3/uL (ref 0.0–0.1)
Basophils Relative: 0.3 % (ref 0.0–3.0)
Eosinophils Absolute: 0 10*3/uL (ref 0.0–0.7)
Lymphocytes Relative: 23.4 % (ref 12.0–46.0)
MCHC: 33.8 g/dL (ref 30.0–36.0)
MCV: 92.3 fl (ref 78.0–100.0)
Monocytes Absolute: 0.4 10*3/uL (ref 0.1–1.0)
Neutrophils Relative %: 70.7 % (ref 43.0–77.0)
Platelets: 290 10*3/uL (ref 150.0–400.0)
RDW: 12.4 % (ref 11.5–14.6)

## 2011-09-03 LAB — LIPID PANEL
LDL Cholesterol: 82 mg/dL (ref 0–99)
Total CHOL/HDL Ratio: 3
Triglycerides: 150 mg/dL — ABNORMAL HIGH (ref 0.0–149.0)

## 2011-09-03 LAB — TSH: TSH: 2.76 u[IU]/mL (ref 0.35–5.50)

## 2011-09-03 LAB — BASIC METABOLIC PANEL
CO2: 25 mEq/L (ref 19–32)
Chloride: 105 mEq/L (ref 96–112)
Potassium: 3.6 mEq/L (ref 3.5–5.1)

## 2011-09-03 MED ORDER — ETODOLAC 400 MG PO TABS: 400.0000 mg | ORAL_TABLET | Freq: Two times a day (BID) | ORAL | Status: DC

## 2011-09-03 MED ORDER — SERTRALINE HCL 50 MG PO TABS: 50.0000 mg | ORAL_TABLET | Freq: Every day | ORAL | Status: DC

## 2011-09-03 MED ORDER — ALPRAZOLAM 0.5 MG PO TABS: ORAL_TABLET | ORAL | Status: DC

## 2011-09-03 NOTE — Patient Instructions (Signed)
Today we updated your med list in EPIC...    We refilled your meds per request...  Today we did your follow up CXR, EKG, & fasting blood work...    Please call the [facility] in a few days for your results...    Dial [id number] & when prompted enter your patient number followed by the # symbol...    Your patient number is:  [id number]#  Keep up the good work off the cigs & on your diet & exercise program...  Call for any questions...  Let's plan a follow up visit in 1 year, sooner if needed for problems.Marland KitchenMarland Kitchen

## 2011-09-03 NOTE — Progress Notes (Signed)
Subjective:    Patient ID: Mia Hunter, female    DOB: Nov 15, 1976, 35 y.o.   MRN: [id number]  HPI 35 y/o WF here for a follow up visit and CPX...   ~  August 24, 2010:  she has been doing well overall but still smoking & "wants to quit"; off Zoloft & wants to try Wellbutrin; still constipated and we discussed laxative/ stool softener Rx; notes freq fever blisters and wants salve...  ~  September 03, 2011:  Yearly ROV & CPX> see prob list below...    She quit smoking on her own 10/11 & has noted improvement in her breathing & exercise capacity- walking 1-2 mi/d, denies cough/ phlegm/ etc...    She gets occas IBS symptoms but not taking the Miralax/ Senokot regularly, just prn...    She has PMDD & ovarian cysts & sm uterine fibroid> all treated by [name] w/ YAZ which is the only thing that has worked for her; she credits the fact that she couldn't be on YAZ & still smoke as the main reason she finally quit smoking (she tried Wellbutrin but it caused mood swing & crying episodes therefore stopped)...    She has mod anxiety controlled on Zoloft regularly & Alprz prn...          Problem List:    Ex- CIGARETTE SMO[employer]ER (ICD-305.1) - smoked 1/2 ppd & was told to quit by GYN- had to stop the YAZ Rx at age 44...  denies cough, phlegm, SOB, CP, etc... ~  7/10:  discussed smoking cessation strategies including cessation programs, counselling, nicotine replacement, and Chantix receptor blockade... she declined help. ~  9/11:  now states that she has decided to quit & want to try Wellbutrin Rx & E-cigs, +rec to consider counselling (she declines), Nicotine replacement, etc... ~  9/12:  Pt states the Wellbutrin reacted on her & didn't tolerate; restarted Zoloft & quit smoking on her own she says, now back on YAZ per Gyn...  IRRITABLE BOWEL SYNDROME (ICD-564.1), & CONSTIPATION (ICD-564.00) - Hx IBS w/ constipation predominent form... states BMs once weekly- firm to hard, sometimes w/ liq stool to  follow... denies abd pain, N/V, blood seen, etc... prev eval by [name] w/ colonoscopy 2001= neg... ~  7/10:  we discussed Rx w/ Metamucil once or twice daily, fluids, etc > no better she says. ~  9/11:  advised to seek help of GI but she declines appt> therefore try combination MIRALAX, SENAKOT-S, & she likes cereal fiber bars... ~  9/12:  She reports improved & uses the laxatives just as needed now...  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 she will discuss further w/ DrHarvett soon... ~  She reports that she has ovarian cysts and sm uterine fibroid being followed by [name]...  BACK PAIN, LUMBAR (ICD-724.2) - she uses ETODOLAC 400mg  Bid Prn...  ANXIETY (ICD-300.00), & DEPRESSION (ICD-311) - on ALPRAZOLAM 0.5mg  Tid Prn... she notes that she uses the Xanax mostly around her menses... back on ZOLOFT 50mg /d and she feels this is really helping...  Hx of ANGIONEUROTIC EDEMA NOT ELSEWHERE CLASSIFIED (ICD-995.1) - evaluated in 1997 for urticaria & angioedema of ?etiology... nothing ever determined and symptoms resolved without recurrence... allergy testing by DrESL was neg- IgE level = 11...    No past surgical history on file.   Outpatient Encounter Prescriptions as of 09/03/2011  Medication Sig Dispense Refill  .  acyclovir (ZOVIRAX) 400 MG tablet Take 400 mg by mouth 4 (four) times daily as needed.        Marland Kitchen acyclovir (ZOVIRAX) 5 % ointment Apply 1 application topically every 3 (three) hours. As directed       . ALPRAZolam (XANAX) 0.5 MG tablet Take 1/2 to 1 tablet by mouth 3 times daily as needed for nerves  90 tablet  5  . etodolac (LODINE) 400 MG tablet Take 400 mg by mouth 2 (two) times daily. As needed for arthritis pain       . sertraline (ZOLOFT) 50 MG tablet Take 50 mg by mouth daily.        . polyethylene glycol (MIRALAX / GLYCOLAX) packet Take 17 g by mouth 2  (two) times daily.        Marland Kitchen senna (SENOKOT) 8.6 MG tablet Take 2 tablets by mouth at bedtime.          Allergies  Allergen Reactions  . Bupropion Hcl     REACTION: mood swings    Current Medications, Allergies, Past Medical History, Past Surgical History, Family History, and Social History were reviewed in [employer] record.    Review of Systems         The patient complains of depression and anxiety.  The patient denies fever, chills, sweats, anorexia, fatigue, weakness, malaise, weight loss, sleep disorder, blurring, diplopia, eye irritation, eye discharge, vision loss, eye pain, photophobia, earache, ear discharge, tinnitus, decreased hearing, nasal congestion, nosebleeds, sore throat, hoarseness, chest pain, palpitations, syncope, dyspnea on exertion, orthopnea, PND, peripheral edema, cough, dyspnea at rest, excessive sputum, hemoptysis, wheezing, pleurisy, nausea, vomiting, diarrhea, constipation, change in bowel habits, abdominal pain, melena, hematochezia, jaundice, gas/bloating, indigestion/heartburn, dysphagia, odynophagia, dysuria, hematuria, urinary frequency, urinary hesitancy, nocturia, incontinence, back pain, joint pain, joint swelling, muscle cramps, muscle weakness, stiffness, arthritis, sciatica, restless legs, leg pain at night, leg pain with exertion, rash, itching, dryness, suspicious lesions, paralysis, paresthesias, seizures, tremors, vertigo, transient blindness, frequent falls, frequent headaches, difficulty walking, memory loss, confusion, cold intolerance, heat intolerance, polydipsia, polyphagia, polyuria, unusual weight change, abnormal bruising, bleeding, enlarged lymph nodes, urticaria, allergic rash, hay fever, and recurrent infections.     Objective:   Physical Exam    WD, WN, 35 y/o WF in NAD... GENERAL:  Alert & oriented; pleasant & cooperative... HEENT:  [place]/AT, EOM-wnl, PERRLA, Fundi-benign, EACs-clear, TMs-wnl, NOSE-clear,  THROAT-clear & wnl. NECK:  Supple w/ full ROM; no JVD; normal carotid impulses w/o bruits; no thyromegaly or nodules palpated; no lymphadenopathy. CHEST:  Clear to P & A; without wheezes/ rales/ or rhonchi. HEART:  Regular Rhythm; without murmurs/ rubs/ or gallops. ABDOMEN:  Soft & nontender; normal bowel sounds; no organomegaly or masses detected. EXT: without deformities or arthritic changes; no varicose veins/ venous insuffic/ or edema. NEURO:  CN's intact; motor testing normal; sensory testing normal; gait normal & balance OK. DERM:  No lesions noted; no rash etc...  CXR:  Clear & WNL.Marland KitchenMarland Kitchen  EKG:  NSR & WNL.Marland KitchenMarland Kitchen  LABS:  FLP, CBC, Chems, TSH, UA > all WNL.Marland KitchenMarland Kitchen   Assessment & Plan:   CPX>  Doing satis and congrats on quitting smoking...  IBS/ Constip>  Improved & she uses the Miralax just as needed now...  PMDD, Ovarian cysts, Uterine fibroid>  All followed by [name], GYN;  She fgeels she is improved again on the YAZ.  Hx LBP>  She uses Lodine as needed; she notes that her LBP radiates to legs & esp around her  menses...  Anxiety>  Improved back on Zoloft & she uses the alpraz as needed...  Other medical issues as noted.Marland KitchenMarland Kitchen

## 2011-09-07 ENCOUNTER — Encounter: Payer: Self-pay | Admitting: Pulmonary Disease

## 2012-03-26 ENCOUNTER — Telehealth: Payer: Self-pay | Admitting: Pulmonary Disease

## 2012-03-26 NOTE — Telephone Encounter (Signed)
Have reviewed all of SN papers and no papers on this pt.  thanks

## 2012-03-26 NOTE — Telephone Encounter (Signed)
I spoke with Mia Hunter and she states Dr. Alfredo Bach her therapist was going to fax over a paper with some medications that they have discussed for Mia Hunter to be on. She states they were to fax this over yesterday. [name] have you seen anything on this Mia Hunter. Please advise thanks

## 2012-03-26 NOTE — Telephone Encounter (Signed)
Spoke with pt and notified that we do not have her papers. She will have Dr. Larena Glassman office refax.

## 2012-03-28 ENCOUNTER — Telehealth: Payer: Self-pay | Admitting: Pulmonary Disease

## 2012-03-28 NOTE — Telephone Encounter (Signed)
I spoke with leigh and she still has not received anything on pt. I made pt aware of this and she is going to call Dr. Alfredo Bach office back making them aware of this. Nothing further was needed

## 2012-03-31 ENCOUNTER — Telehealth: Payer: Self-pay | Admitting: Pulmonary Disease

## 2012-03-31 NOTE — Telephone Encounter (Signed)
Yes papers have been received.  Will give to SN to review.  Does pt want appt with SN to discuss med changes/med management?

## 2012-03-31 NOTE — Telephone Encounter (Signed)
LMOMTCB x 1 

## 2012-03-31 NOTE — Telephone Encounter (Signed)
[name] have you received paperwork on this patient? Carron Curie, CMA

## 2012-03-31 NOTE — Telephone Encounter (Signed)
Pt wants to hold off until after SN reviews the papers to decide if she needs to come in for OV. She states she will be starting a new job and does not want to ask off this soon. Carron Curie, CMA

## 2012-04-02 ENCOUNTER — Other Ambulatory Visit: Payer: Self-pay | Admitting: Pulmonary Disease

## 2012-04-02 MED ORDER — ALPRAZOLAM 0.5 MG PO TABS: ORAL_TABLET | ORAL | Status: DC

## 2012-04-02 NOTE — Telephone Encounter (Signed)
Rx called to pts pharmacy

## 2012-04-04 ENCOUNTER — Telehealth: Payer: Self-pay | Admitting: Pulmonary Disease

## 2012-04-04 NOTE — Telephone Encounter (Signed)
Pt says leigh just called her. Mia Hunter

## 2012-04-04 NOTE — Telephone Encounter (Signed)
Called and spoke with pt and she is aware that i will call and speak [facility] at [phone] to see if we can get her in with psychiatrist.

## 2012-04-04 NOTE — Telephone Encounter (Signed)
lmomtcb for the pt.  

## 2012-04-04 NOTE — Telephone Encounter (Signed)
Called and spoke with Mia Hunter and she is aware that i have tried to find someone to get her in with but have not been able to find anyone.  Mia Hunter is [facility] and Mia Hunter at [facility] to see about getting in with someone that has a psychiatrist to help follow with meds ( lamictal or abilify) since SN does not prescribe these meds.  Pt is aware to call back for any other concerns or questions.

## 2012-06-04 ENCOUNTER — Telehealth: Payer: Self-pay | Admitting: Pulmonary Disease

## 2012-06-04 NOTE — Telephone Encounter (Signed)
Called and spoke with pt and she is aware of SN recs---no salt, elevate her feet when she can, wear support hose regularly, check with leslie for the side effects of the abilify.  rov with SN to check BP/  Pt is aware of appt tomorrow at 2 with SN.

## 2012-06-04 NOTE — Telephone Encounter (Signed)
Called and spoke with pt and she stated that her ankles have been swelling for about 6-8 months. She stated this is not severe but she can push in on her ankles and this leaves a mark. Happens about 2-3 times per week.  Her feet and legs ache all the time.  She feels like she has to move her legs all the time. Pt c/o dizziness all the time.  Denies any CP or increase in SOB.   Pt did state that she is not seeing Mia Hunter and she has started on the abilify but all these symptoms started before this med.  SN please advise. Thanks  Allergies  Allergen Reactions  . Bupropion Hcl     REACTION: mood swings

## 2012-06-05 ENCOUNTER — Ambulatory Visit: Payer: BC Managed Care – PPO | Admitting: Pulmonary Disease

## 2012-06-06 ENCOUNTER — Ambulatory Visit (INDEPENDENT_AMBULATORY_CARE_PROVIDER_SITE_OTHER): Payer: BC Managed Care – PPO | Admitting: Pulmonary Disease

## 2012-06-06 ENCOUNTER — Encounter: Payer: Self-pay | Admitting: Pulmonary Disease

## 2012-06-06 VITALS — BP 124/80 | HR 79 | Temp 97.6°F | Ht 65.0 in | Wt 181.0 lb

## 2012-06-06 DIAGNOSIS — F411 Generalized anxiety disorder: Secondary | ICD-10-CM

## 2012-06-06 DIAGNOSIS — F329 Major depressive disorder, single episode, unspecified: Secondary | ICD-10-CM

## 2012-06-06 DIAGNOSIS — R609 Edema, unspecified: Secondary | ICD-10-CM | POA: Insufficient documentation

## 2012-06-06 DIAGNOSIS — G2581 Restless legs syndrome: Secondary | ICD-10-CM | POA: Insufficient documentation

## 2012-06-06 DIAGNOSIS — N943 Premenstrual tension syndrome: Secondary | ICD-10-CM

## 2012-06-06 MED ORDER — PRAMIPEXOLE DIHYDROCHLORIDE 0.125 MG PO TABS: ORAL_TABLET | ORAL | Status: DC

## 2012-06-06 NOTE — Patient Instructions (Addendum)
Today we updated your med list in our EPIC system...    Continue your current medications the same...  For the Restless Leg Syndrome>>    Try the MIRAPEX 0.125mg  tabs - start 1/2 tab twice daily & increase to one tab twice daily if needed for full effect...  Remember to eliminate the salt from your diet as best you can...     Elevate your legs as needed & consider wearing support hose if necessary...  Call for any questions.Marland KitchenMarland Kitchen

## 2012-06-07 ENCOUNTER — Encounter: Payer: Self-pay | Admitting: Pulmonary Disease

## 2012-06-07 NOTE — Progress Notes (Signed)
Subjective:    Patient ID: Mia Hunter, female    DOB: 09-07-1976, 36 y.o.   MRN: [id number]  HPI 36 y/o WF here for a follow up visit, she is the daughter of [name]...  ~  August 24, 2010:  she has been doing well overall but still smoking & "wants to quit"; off Zoloft & wants to try Wellbutrin; still constipated and we discussed laxative/ stool softener Rx; notes freq fever blisters and wants salve...  ~  September 03, 2011:  Yearly ROV & CPX> see prob list below...    She quit smoking on her own 10/11 & has noted improvement in her breathing & exercise capacity- walking 1-2 mi/d, denies cough/ phlegm/ etc...    She gets occas IBS symptoms but not taking the Miralax/ Senokot regularly, just prn...    She has PMDD & ovarian cysts & sm uterine fibroid> all treated by [name] w/ YAZ which is the only thing that has worked for her; she credits the fact that she couldn't be on YAZ & still smoke as the main reason she finally quit smoking (she tried Wellbutrin but it caused mood swing & crying episodes therefore stopped)...    She has mod anxiety controlled on Zoloft regularly & Alprz prn...  ~  June 06, 2012:  43mo ROV & add-on appt> her CC is intermittent ankle swelling that tends to be worse late in the day & resolve overnight, intermittent not present every day maybe 2-3 days per week==> we discussed the role of Sodium, low salt diet, elevation, support hose (she does not need diuretic)...  Also c/o legs aching & feels like she has to move them all the time to get comfortable; symptom present all day but worse Qhs, affects arms too==> we discussed RLS & rec trial MIRAPEX 0.125mg  starting 1/2 Bid & incr to 1Bid (she will check w/ Psychiatry to be sure her Abilify isn't a culprit)...  Also notes some dizziness but she thinks it's due to stress & all that she's been going thru recently...    She has been seeing Psychologist/MSW [name] from [facility] referred to Psychiatry  [name] & sees NP [name]> started on ABILIFY which she feels is helping...   We reviewed prob list, meds, xrays and labs> see below>>          Problem List:    Ex- CIGARETTE SMOKER (ICD-305.1) - smoked 1/2 ppd & was told to quit by GYN- had to stop the YAZ Rx at age 39...  denies cough, phlegm, SOB, CP, etc... ~  7/10:  discussed smoking cessation strategies including cessation programs, counselling, nicotine replacement, and Chantix receptor blockade... she declined help. ~  9/11:  now states that she has decided to quit & want to try Wellbutrin Rx & E-cigs, +rec to consider counselling (she declines), Nicotine replacement, etc... ~  9/12:  Pt states the Wellbutrin reacted on her & didn't tolerate; restarted Zoloft & quit smoking on her own she says, now back on YAZ per Gyn...  IRRITABLE BOWEL SYNDROME (ICD-564.1), & CONSTIPATION (ICD-564.00) - Hx IBS w/ constipation predominent form... states BMs once weekly- firm to hard, sometimes w/ liq stool to follow... denies abd pain, N/V, blood seen, etc... prev eval by [name] w/ colonoscopy 2001= neg... ~  7/10:  we discussed Rx w/ Metamucil once or twice daily, fluids, etc > no better she says. ~  9/11:  advised to seek help of GI but she declines appt> therefore try combination  MIRALAX, SENAKOT-S, & she likes cereal fiber bars... ~  9/12:  She reports improved & uses the laxatives just as needed now...  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 she will discuss further w/ DrHarvett soon... ~  She reports that she has ovarian cysts and sm uterine fibroid being followed by [name]... ~  GYN also prescribes ZOVIRAX tabs & ointment for as needed use...  BACK PAIN, LUMBAR (ICD-724.2) - she uses ETODOLAC 400mg  Bid Prn...  C/O Ankle Edema & ?RLS >> presented 6/13 for add-on visit due to intermittent ankle edema & complaints of  uncomfortable movement  In legs & arms (see above); we discussed NO SALT etc & trial MIRAPEX for the RLS...  ANXIETY (ICD-300.00), & DEPRESSION (ICD-311) - on ALPRAZOLAM 0.5mg  Tid Prn... she notes that she uses the Xanax mostly around her menses... ~  She was referred by Family services, [name] to [facility] & sees NP [name]> prescribed ABILIFY 15mg - 1/2 tab daily & pt says improved...  Hx of ANGIONEUROTIC EDEMA NOT ELSEWHERE CLASSIFIED (ICD-995.1) - evaluated in 1997 for urticaria & angioedema of ?etiology... nothing ever determined and symptoms resolved without recurrence... allergy testing by DrESL was neg- IgE level = 11...    Past Surgical History  Procedure Date  . Hernia repair 10/2010    done by [facility]     Outpatient Encounter Prescriptions as of 06/06/2012  Medication Sig Dispense Refill  . acyclovir (ZOVIRAX) 400 MG tablet Take 400 mg by mouth 4 (four) times daily as needed.        Marland Kitchen acyclovir (ZOVIRAX) 5 % ointment Apply 1 application topically every 3 (three) hours. As directed       . ALPRAZolam (XANAX) 0.5 MG tablet Take 1/2 to 1 tablet by mouth 3 times daily as needed for nerves  90 tablet  5  . ARIPiprazole (ABILIFY) 15 MG tablet Take 1/2 tablet by mouth daily      . etodolac (LODINE) 400 MG tablet Take 1 tablet (400 mg total) by mouth 2 (two) times daily. As needed for arthritis pain  60 tablet  11  . polyethylene glycol (MIRALAX / GLYCOLAX) packet Take 17 g by mouth 2 (two) times daily.        Marland Kitchen senna (SENOKOT) 8.6 MG tablet Take 2 tablets by mouth at bedtime.        . pramipexole (MIRAPEX) 0.125 MG tablet Take 1/2 tablet by mouth two times daily for 2 weeks then up to 1 tablet by mouth two times daily as needed  60 tablet  5  . DISCONTD: sertraline (ZOLOFT) 50 MG tablet Take 1 tablet (50 mg total) by mouth daily.  30 tablet  11    Allergies  Allergen Reactions  . Bupropion Hcl     REACTION: mood swings    Current Medications, Allergies, Past Medical  History, Past Surgical History, Family History, and Social History were reviewed in [employer] record.    Review of Systems         The patient complains of depression and anxiety.  The patient denies fever, chills, sweats, anorexia, fatigue, weakness, malaise, weight loss, sleep disorder, blurring, diplopia, eye irritation, eye discharge, vision loss, eye pain, photophobia, earache, ear discharge, tinnitus, decreased hearing, nasal congestion, nosebleeds, sore throat, hoarseness, chest pain, palpitations, syncope, dyspnea on exertion, orthopnea, PND, peripheral edema, cough, dyspnea at rest,  excessive sputum, hemoptysis, wheezing, pleurisy, nausea, vomiting, diarrhea, constipation, change in bowel habits, abdominal pain, melena, hematochezia, jaundice, gas/bloating, indigestion/heartburn, dysphagia, odynophagia, dysuria, hematuria, urinary frequency, urinary hesitancy, nocturia, incontinence, back pain, joint pain, joint swelling, muscle cramps, muscle weakness, stiffness, arthritis, sciatica, restless legs, leg pain at night, leg pain with exertion, rash, itching, dryness, suspicious lesions, paralysis, paresthesias, seizures, tremors, vertigo, transient blindness, frequent falls, frequent headaches, difficulty walking, memory loss, confusion, cold intolerance, heat intolerance, polydipsia, polyphagia, polyuria, unusual weight change, abnormal bruising, bleeding, enlarged lymph nodes, urticaria, allergic rash, hay fever, and recurrent infections.     Objective:   Physical Exam    WD, WN, 36 y/o WF in NAD... GENERAL:  Alert & oriented; pleasant & cooperative... HEENT:  [place]/AT, EOM-wnl, PERRLA, Fundi-benign, EACs-clear, TMs-wnl, NOSE-clear, THROAT-clear & wnl. NECK:  Supple w/ full ROM; no JVD; normal carotid impulses w/o bruits; no thyromegaly or nodules palpated; no lymphadenopathy. CHEST:  Clear to P & A; without wheezes/ rales/ or rhonchi. HEART:  Regular Rhythm; without  murmurs/ rubs/ or gallops. ABDOMEN:  Soft & nontender; normal bowel sounds; no organomegaly or masses detected. EXT: without deformities or arthritic changes; no varicose veins/ venous insuffic/ or edema. NEURO:  CN's intact; motor testing normal; sensory testing normal; gait normal & balance OK. DERM:  No lesions noted; no rash etc...  CXR 9/12:  Clear & WNL.Marland KitchenMarland Kitchen  EKG 9/12:  NSR & WNL.Marland KitchenMarland Kitchen  LABS 9/12:  FLP, CBC, Chems, TSH, UA > all WNL.Marland KitchenMarland Kitchen   Assessment & Plan:   Concern for ankle edema> none present on exam 6/13; we discussed no salt, elevation, support hose, etc...   ?RLS>  We discussed trial MIRAPEX 0.125mg - start 1/2 tab bid & incr to 1Bid if nec...   IBS/ Constip>  Improved & she uses the Miralax just as needed now...  PMDD, Ovarian cysts, Uterine fibroid>  All followed by [name], GYN;  She fgeels she is improved again on the YAZ.  Hx LBP>  She uses Lodine as needed; she notes that her LBP radiates to legs & esp around her menses...  Anxiety/ Psyche>  Improved back on Zoloft & she uses the alpraz as needed...  Other medical issues as noted...   Patient's Medications  New Prescriptions   PRAMIPEXOLE (MIRAPEX) 0.125 MG TABLET    Take 1/2 tablet by mouth two times daily for 2 weeks then up to 1 tablet by mouth two times daily as needed  Previous Medications   ACYCLOVIR (ZOVIRAX) 400 MG TABLET    Take 400 mg by mouth 4 (four) times daily as needed.     ACYCLOVIR (ZOVIRAX) 5 % OINTMENT    Apply 1 application topically every 3 (three) hours. As directed    ALPRAZOLAM (XANAX) 0.5 MG TABLET    Take 1/2 to 1 tablet by mouth 3 times daily as needed for nerves   ARIPIPRAZOLE (ABILIFY) 15 MG TABLET    Take 1/2 tablet by mouth daily   ETODOLAC (LODINE) 400 MG TABLET    Take 1 tablet (400 mg total) by mouth 2 (two) times daily. As needed for arthritis pain   POLYETHYLENE GLYCOL (MIRALAX / GLYCOLAX) PACKET    Take 17 g by mouth 2 (two) times daily.     SENNA (SENOKOT) 8.6 MG TABLET     Take 2 tablets by mouth at bedtime.    Modified Medications   No medications on file  Discontinued Medications   SERTRALINE (ZOLOFT) 50 MG TABLET    Take 1 tablet (50 mg  total) by mouth daily.

## 2012-07-23 ENCOUNTER — Other Ambulatory Visit: Payer: Self-pay | Admitting: Nurse Practitioner

## 2012-07-23 DIAGNOSIS — Z1231 Encounter for screening mammogram for malignant neoplasm of breast: Secondary | ICD-10-CM

## 2012-08-11 ENCOUNTER — Ambulatory Visit: Payer: BC Managed Care – PPO

## 2012-10-27 ENCOUNTER — Other Ambulatory Visit: Payer: Self-pay | Admitting: Pulmonary Disease

## 2012-12-19 ENCOUNTER — Ambulatory Visit: Payer: BC Managed Care – PPO | Admitting: Pulmonary Disease

## 2012-12-22 ENCOUNTER — Ambulatory Visit: Payer: BC Managed Care – PPO | Admitting: Pulmonary Disease

## 2013-01-21 ENCOUNTER — Encounter: Payer: Self-pay | Admitting: Pulmonary Disease

## 2013-01-21 ENCOUNTER — Ambulatory Visit (INDEPENDENT_AMBULATORY_CARE_PROVIDER_SITE_OTHER): Payer: BC Managed Care – PPO | Admitting: Pulmonary Disease

## 2013-01-21 VITALS — BP 122/84 | HR 101 | Temp 97.5°F | Ht 65.0 in | Wt 171.2 lb

## 2013-01-21 DIAGNOSIS — K589 Irritable bowel syndrome without diarrhea: Secondary | ICD-10-CM

## 2013-01-21 DIAGNOSIS — F411 Generalized anxiety disorder: Secondary | ICD-10-CM

## 2013-01-21 DIAGNOSIS — Z0000 Encounter for general adult medical examination without abnormal findings: Secondary | ICD-10-CM

## 2013-01-21 DIAGNOSIS — K5900 Constipation, unspecified: Secondary | ICD-10-CM

## 2013-01-21 DIAGNOSIS — M545 Low back pain: Secondary | ICD-10-CM

## 2013-01-21 DIAGNOSIS — F329 Major depressive disorder, single episode, unspecified: Secondary | ICD-10-CM

## 2013-01-21 MED ORDER — ETODOLAC 400 MG PO TABS: 400.0000 mg | ORAL_TABLET | Freq: Two times a day (BID) | ORAL | Status: DC

## 2013-01-21 MED ORDER — ALPRAZOLAM 0.5 MG PO TABS: ORAL_TABLET | ORAL | Status: DC

## 2013-01-21 MED ORDER — FLUOXETINE HCL 20 MG PO CAPS: 20.0000 mg | ORAL_CAPSULE | Freq: Every day | ORAL | Status: DC

## 2013-01-21 NOTE — Progress Notes (Addendum)
Subjective:    Patient ID: Mia Hunter, female    DOB: 1976/05/10, 37 y.o.   MRN: [id number]  HPI 37 y/o WF here for a follow up visit, she is the daughter of Mia Hunter...  ~  August 24, 2010:  she has been doing well overall but still smoking & "wants to quit"; off Zoloft & wants to try Wellbutrin; still constipated and we discussed laxative/ stool softener Rx; notes freq fever blisters and wants salve...  ~  September 03, 2011:  Yearly ROV & CPX> see prob list below...    She quit smoking on her own 10/11 & has noted improvement in her breathing & exercise capacity- walking 1-2 mi/d, denies cough/ phlegm/ etc...    She gets occas IBS symptoms but not taking the Miralax/ Senokot regularly, just prn...    She has PMDD & ovarian cysts & sm uterine fibroid> all treated by Mia Hunter w/ YAZ which is the only thing that has worked for her; she credits the fact that she couldn't be on YAZ & still smoke as the main reason she finally quit smoking (she tried Wellbutrin but it caused mood swing & crying episodes therefore stopped)...    She has mod anxiety controlled on Zoloft regularly & Alprz prn... CXR 9/12:  Clear & WNL.Marland KitchenMarland Kitchen EKG 9/12:  NSR & WNL.Marland KitchenMarland Kitchen LABS 9/12:  FLP, CBC, Chems, TSH, UA > all WNL.Marland Kitchen.  ~  June 06, 2012:  61mo ROV & add-on appt> her CC is intermittent ankle swelling that tends to be worse late in the day & resolve overnight, intermittent not present every day maybe 2-3 days per week==> we discussed the role of Sodium, low salt diet, elevation, support hose (she does not need diuretic)...  Also c/o legs aching & feels like she has to move them all the time to get comfortable; symptom present all day but worse Qhs, affects arms too==> we discussed RLS & rec trial MIRAPEX 0.125mg  starting 1/2 Bid & incr to 1Bid (she will check w/ Psychiatry to be sure her Abilify isn't a culprit)...  Also notes some dizziness but she thinks it's due to stress & all that she's been going thru  recently...    She has been seeing Psychologist/MSW Mia Hunter from [facility] referred to Psychiatry Mia Hunter & sees NP Mia Hunter> started on ABILIFY which she feels is helping...   We reviewed prob list, meds, xrays and labs> see below>>  ~  January 21, 2013:  34mo ROV & time for her annual CPX> Mia Hunter reports improved overall- states that she decided to stop the psychotropic meds from [employer] [employer] & [facility] since they all seemed to have signif side effects (worse anxiety, incr agoraphobia, restless leg symptoms), noted she was feeling better off these meds; she states the agoraphobia is better, back working at [employer], etc; she does c/o some anxiety & Alpraz0.5mg  reallly helps, but also notes some depression & we discussed trial Fluoxetine20mg /d...  We reviewed the following medical problems during today's office visit >>      Ex-smoker> she quit for good in 2010; denies cough, sput, hemoptysis, SOB, etc...    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    Hx PMDD> followed by Mia Hunter; prev on Yaz & nuvaring but off all meds now; she has had a BTL in past...    Hx LBP> on Etodolac 400mg  Bid as needed...    Hx ?RLS> these symptoms pretty much  resolved off the psychotopic meds...    Anxiety & Depression> she was diagnosed Bipolar disorder & agoraphobia w/ panic by Family services [facility] & [facility];  She has been tried on mult meds eg.Abilify and she states mult side effects like worse anxiety, incr agoraphobia, & restless leg symptoms;  Pt decided to stop all these meds & just use the Alpraz prn=> she reports much improved overall...  We reviewed prob list, meds, xrays and labs> see below for updates >> she declined the 2013 flu vaccine... LABS 1/14:  FLP- at goals on diet alone;  Chems- wnl;  CBC- wnl;  TSH=1.47           Problem List:    Ex- CIGARETTE SMOKER (ICD-305.1) - smoked 1/2 ppd & was told to quit by GYN- had to  stop the YAZ Rx at age 45...  denies cough, phlegm, SOB, CP, etc... ~  7/10:  discussed smoking cessation strategies including cessation programs, counselling, nicotine replacement, and Chantix receptor blockade... she declined help. ~  9/11:  now states that she has decided to quit & want to try Wellbutrin Rx & E-cigs, +rec to consider counselling (she declines), Nicotine replacement, etc... ~  9/12:  Pt states the Wellbutrin reacted on her & didn't tolerate; restarted Zoloft & quit smoking on her own she says, now back on YAZ per Gyn... ~  1/14:  She remains smoke free & asymptomatic...  IRRITABLE BOWEL SYNDROME (ICD-564.1), & CONSTIPATION (ICD-564.00) - Hx IBS w/ constipation predominent form... states BMs once weekly- firm to hard, sometimes w/ liq stool to follow... denies abd pain, N/V, blood seen, etc... prev eval by Mia Hunter w/ colonoscopy 2001= neg... ~  7/10:  we discussed Rx w/ Metamucil once or twice daily, fluids, etc > no better she says. ~  9/11:  advised to seek help of GI but she declines appt> therefore try combination MIRALAX, SENAKOT-S, & she likes cereal fiber bars... ~  9/12:  She reports improved & uses the laxatives just as needed now... ~  1/14:  Encouraged to use the Miralax 7 Senakot-s more regularly for her constipation...  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 she will discuss further w/ DrHarvett soon... ~  She reports that she has ovarian cysts and sm uterine fibroid being followed by Mia Hunter... ~  GYN also prescribes ZOVIRAX tabs & ointment for as needed use...  BACK PAIN, LUMBAR (ICD-724.2) - she uses ETODOLAC 400mg  Bid Prn...  C/O Ankle Edema & ?RLS >> presented 6/13 for add-on visit due to intermittent ankle edema & complaints of uncomfortable movement  In legs & arms (see above); we discussed NO SALT etc & trial MIRAPEX for the RLS (she  reports this helped, but symptoms mostly resolved off the Abilify 7 psychotropics)...   ANXIETY (ICD-300.00), & DEPRESSION (ICD-311) - on ALPRAZOLAM 0.5mg  Tid Prn... she notes that she uses the Xanax mostly around her menses... ~  She was referred by Family services, [facility] to [facility] & sees NP Mia Hunter> prescribed ABILIFY 15mg - 1/2 tab daily & pt says improved...  Hx of ANGIONEUROTIC EDEMA NOT ELSEWHERE CLASSIFIED (ICD-995.1) - evaluated in 1997 for urticaria & angioedema of ?etiology... nothing ever determined and symptoms resolved without recurrence... allergy testing by DrESL was neg- IgE level = 11...   HEALTH MAINTENANCE: she quit smoking 2010... ~  GI:  Followed by Dorris Singh, known IBS-c,  had neg colonoscopy 2001... ~  GYN:  Followed by [facility] for gyn... ~  Immuniz:  She declines the flu shot;  ?when her last tetanus shot was given,     Past Surgical History  Procedure Date  . Hernia repair 10/2010    done by [facility]     Outpatient Encounter Prescriptions as of 01/21/2013  Medication Sig Dispense Refill  . acyclovir (ZOVIRAX) 400 MG tablet Take 400 mg by mouth 4 (four) times daily as needed.        Marland Kitchen acyclovir (ZOVIRAX) 5 % ointment Apply 1 application topically every 3 (three) hours. As directed       . ALPRAZolam (XANAX) 0.5 MG tablet AKE 1/2 TO 1 TBLET BY MOUTH 3 TIMES A DAY AS NEEDED  90 tablet  2  . etodolac (LODINE) 400 MG tablet Take 1 tablet (400 mg total) by mouth 2 (two) times daily. As needed for arthritis pain  60 tablet  11  . polyethylene glycol (MIRALAX / GLYCOLAX) packet Take 17 g by mouth 2 (two) times daily.        Marland Kitchen senna (SENOKOT) 8.6 MG tablet Take 2 tablets by mouth at bedtime.        . [DISCONTINUED] ARIPiprazole (ABILIFY) 15 MG tablet Take 1/2 tablet by mouth daily      . [DISCONTINUED] pramipexole (MIRAPEX) 0.125 MG tablet Take 1/2 tablet by mouth two times daily for 2 weeks then up to 1 tablet by mouth two times daily as needed  60 tablet   5    Allergies  Allergen Reactions  . Bupropion Hcl     REACTION: mood swings    Current Medications, Allergies, Past Medical History, Past Surgical History, Family History, and Social History were reviewed in [employer] record.    Review of Systems         The patient complains of depression and anxiety.  The patient denies fever, chills, sweats, anorexia, fatigue, weakness, malaise, weight loss, sleep disorder, blurring, diplopia, eye irritation, eye discharge, vision loss, eye pain, photophobia, earache, ear discharge, tinnitus, decreased hearing, nasal congestion, nosebleeds, sore throat, hoarseness, chest pain, palpitations, syncope, dyspnea on exertion, orthopnea, PND, peripheral edema, cough, dyspnea at rest, excessive sputum, hemoptysis, wheezing, pleurisy, nausea, vomiting, diarrhea, constipation, change in bowel habits, abdominal pain, melena, hematochezia, jaundice, gas/bloating, indigestion/heartburn, dysphagia, odynophagia, dysuria, hematuria, urinary frequency, urinary hesitancy, nocturia, incontinence, back pain, joint pain, joint swelling, muscle cramps, muscle weakness, stiffness, arthritis, sciatica, restless legs, leg pain at night, leg pain with exertion, rash, itching, dryness, suspicious lesions, paralysis, paresthesias, seizures, tremors, vertigo, transient blindness, frequent falls, frequent headaches, difficulty walking, memory loss, confusion, cold intolerance, heat intolerance, polydipsia, polyphagia, polyuria, unusual weight change, abnormal bruising, bleeding, enlarged lymph nodes, urticaria, allergic rash, hay fever, and recurrent infections.     Objective:   Physical Exam    WD, WN, 37 y/o WF in NAD... GENERAL:  Alert & oriented; pleasant & cooperative... HEENT:  [place]/AT, EOM-wnl, PERRLA, Fundi-benign, EACs-clear, TMs-wnl, NOSE-clear, THROAT-clear & wnl. NECK:  Supple w/ full ROM; no JVD; normal carotid impulses w/o bruits; no thyromegaly  or nodules palpated; no lymphadenopathy. CHEST:  Clear to P & A; without wheezes/ rales/ or rhonchi. HEART:  Regular Rhythm; without murmurs/ rubs/ or gallops. ABDOMEN:  Soft & nontender; normal bowel sounds; no organomegaly or masses detected. EXT: without deformities or arthritic changes; no varicose veins/ venous insuffic/ or edema. NEURO:  CN's intact; motor testing normal; sensory testing normal; gait normal & balance  OK. DERM:  No lesions noted; no rash etc...  RADIOLOGY DATA:  Reviewed in the EPIC EMR & discussed w/ the patient...  LABORATORY DATA:  Reviewed in the EPIC EMR & discussed w/ the patient...   Assessment & Plan:   CPX>>    IBS/ Constip>  Improved but constip continues & encouraged to use the Miralax/ Senakot-S more regularly...  PMDD, Ovarian cysts, Uterine fibroid>  All followed by Mia Hunter, GYN and off prev yaz & nuvaring...  Hx LBP>  She uses Lodine as needed; she notes that her LBP radiates to legs & esp around her menses...  Anxiety/ Psyche>  she uses the Alpraz as needed; states she is feeling better off the psychotropics but notes some anxiety/ panic symptoms and intermittent depression; we discussed trial Prozac20 to see if it helps both symptoms...  Other medical issues as noted...   Patient's Medications  New Prescriptions   FLUOXETINE (PROZAC) 20 MG CAPSULE    Take 1 capsule (20 mg total) by mouth daily.  Previous Medications   ACYCLOVIR (ZOVIRAX) 400 MG TABLET    Take 400 mg by mouth 4 (four) times daily as needed.     ACYCLOVIR (ZOVIRAX) 5 % OINTMENT    Apply 1 application topically every 3 (three) hours. As directed    POLYETHYLENE GLYCOL (MIRALAX / GLYCOLAX) PACKET    Take 17 g by mouth 2 (two) times daily.     SENNA (SENOKOT) 8.6 MG TABLET    Take 2 tablets by mouth at bedtime.    Modified Medications   Modified Medication Previous Medication   ALPRAZOLAM (XANAX) 0.5 MG TABLET ALPRAZolam (XANAX) 0.5 MG tablet      Take 1/2 to 1 tablet by  mouth three times daily as needed for nerves    AKE 1/2 TO 1 TBLET BY MOUTH 3 TIMES A DAY AS NEEDED   ETODOLAC (LODINE) 400 MG TABLET etodolac (LODINE) 400 MG tablet      Take 1 tablet (400 mg total) by mouth 2 (two) times daily. As needed for arthritis pain    Take 1 tablet (400 mg total) by mouth 2 (two) times daily. As needed for arthritis pain  Discontinued Medications   ARIPIPRAZOLE (ABILIFY) 15 MG TABLET    Take 1/2 tablet by mouth daily   PRAMIPEXOLE (MIRAPEX) 0.125 MG TABLET    Take 1/2 tablet by mouth two times daily for 2 weeks then up to 1 tablet by mouth two times daily as needed

## 2013-01-21 NOTE — Patient Instructions (Addendum)
Today we updated your med list in our EPIC system...    Continue your current medications the same...    We refilled your Etodolac & Alprazolam as requested...  We decided to try Fluoxetine (generic prozac) 20mg  one tab daily to see if this will work for you...  Please return to our lab in the AM for your fasting blood work...    We will contact you w/ the results when avail...  Let's plan a follow up visit in 6 months, sooner if needed for any reason.Marland KitchenMarland Kitchen

## 2013-01-22 ENCOUNTER — Other Ambulatory Visit (INDEPENDENT_AMBULATORY_CARE_PROVIDER_SITE_OTHER): Payer: BC Managed Care – PPO

## 2013-01-22 DIAGNOSIS — Z0000 Encounter for general adult medical examination without abnormal findings: Secondary | ICD-10-CM

## 2013-01-22 LAB — CBC WITH DIFFERENTIAL/PLATELET
Basophils Absolute: 0.1 10*3/uL (ref 0.0–0.1)
Eosinophils Relative: 0.1 % (ref 0.0–5.0)
HCT: 41.4 % (ref 36.0–46.0)
Lymphocytes Relative: 29.5 % (ref 12.0–46.0)
Monocytes Relative: 6.3 % (ref 3.0–12.0)
Neutrophils Relative %: 63.1 % (ref 43.0–77.0)
Platelets: 296 10*3/uL (ref 150.0–400.0)
RDW: 12.8 % (ref 11.5–14.6)
WBC: 5.4 10*3/uL (ref 4.5–10.5)

## 2013-01-22 LAB — LIPID PANEL
LDL Cholesterol: 91 mg/dL (ref 0–99)
Total CHOL/HDL Ratio: 3
Triglycerides: 58 mg/dL (ref 0.0–149.0)

## 2013-01-22 LAB — HEPATIC FUNCTION PANEL
AST: 14 U/L (ref 0–37)
Albumin: 3.6 g/dL (ref 3.5–5.2)
Bilirubin, Direct: 0.1 mg/dL (ref 0.0–0.3)
Total Bilirubin: 1 mg/dL (ref 0.3–1.2)
Total Protein: 7.1 g/dL (ref 6.0–8.3)

## 2013-01-22 LAB — BASIC METABOLIC PANEL
Calcium: 9.3 mg/dL (ref 8.4–10.5)
Chloride: 107 mEq/L (ref 96–112)
Creatinine, Ser: 0.8 mg/dL (ref 0.4–1.2)
Sodium: 140 mEq/L (ref 135–145)

## 2013-01-22 LAB — TSH: TSH: 1.47 u[IU]/mL (ref 0.35–5.50)

## 2013-02-10 ENCOUNTER — Other Ambulatory Visit: Payer: Self-pay | Admitting: Pulmonary Disease

## 2013-07-22 ENCOUNTER — Encounter: Payer: Self-pay | Admitting: Pulmonary Disease

## 2013-07-22 ENCOUNTER — Ambulatory Visit (INDEPENDENT_AMBULATORY_CARE_PROVIDER_SITE_OTHER): Payer: BC Managed Care – PPO | Admitting: Pulmonary Disease

## 2013-07-22 VITALS — BP 110/78 | HR 66 | Temp 98.7°F | Ht 65.0 in | Wt 175.0 lb

## 2013-07-22 DIAGNOSIS — N943 Premenstrual tension syndrome: Secondary | ICD-10-CM

## 2013-07-22 DIAGNOSIS — K5900 Constipation, unspecified: Secondary | ICD-10-CM

## 2013-07-22 DIAGNOSIS — K589 Irritable bowel syndrome without diarrhea: Secondary | ICD-10-CM

## 2013-07-22 DIAGNOSIS — F411 Generalized anxiety disorder: Secondary | ICD-10-CM

## 2013-07-22 DIAGNOSIS — F329 Major depressive disorder, single episode, unspecified: Secondary | ICD-10-CM

## 2013-07-22 MED ORDER — LINACLOTIDE 290 MCG PO CAPS: 1.0000 | ORAL_CAPSULE | Freq: Every day | ORAL | Status: DC

## 2013-07-22 MED ORDER — VENLAFAXINE HCL ER 75 MG PO CP24: 75.0000 mg | ORAL_CAPSULE | Freq: Two times a day (BID) | ORAL | Status: DC

## 2013-07-22 NOTE — Progress Notes (Signed)
Subjective:    Patient ID: Mia Hunter, female    DOB: June 11, 1976, 37 y.o.   MRN: [id number]  HPI 37 y/o WF here for a follow up visit, she is the daughter of [name]...  ~  September 03, 2011:  Yearly ROV & CPX> see prob list below...    She quit smoking on her own 10/11 & has noted improvement in her breathing & exercise capacity- walking 1-2 mi/d, denies cough/ phlegm/ etc...    She gets occas IBS symptoms but not taking the Miralax/ Senokot regularly, just prn...    She has PMDD & ovarian cysts & sm uterine fibroid> all treated by [name] w/ YAZ which is the only thing that has worked for her; she credits the fact that she couldn't be on YAZ & still smoke as the main reason she finally quit smoking (she tried Wellbutrin but it caused mood swing & crying episodes therefore stopped)...    She has mod anxiety controlled on Zoloft regularly & Alprz prn... CXR 9/12:  Clear & WNL.Marland KitchenMarland Kitchen EKG 9/12:  NSR & WNL.Marland KitchenMarland Kitchen LABS 9/12:  FLP, CBC, Chems, TSH, UA > all WNL.Marland Kitchen.  ~  June 06, 2012:  33mo ROV & add-on appt> her CC is intermittent ankle swelling that tends to be worse late in the day & resolve overnight, intermittent not present every day maybe 2-3 days per week==> we discussed the role of Sodium, low salt diet, elevation, support hose (she does not need diuretic)...  Also c/o legs aching & feels like she has to move them all the time to get comfortable; symptom present all day but worse Qhs, affects arms too==> we discussed RLS & rec trial MIRAPEX 0.125mg  starting 1/2 Bid & incr to 1Bid (she will check w/ Psychiatry to be sure her Abilify isn't a culprit)...  Also notes some dizziness but she thinks it's due to stress & all that she's been going thru recently...    She has been seeing Psychologist/MSW [name] from [facility] referred to Psychiatry [name] & sees NP [name]> started on ABILIFY which she feels is helping...   We reviewed prob list, meds, xrays and labs> see  below>>  ~  January 21, 2013:  52mo ROV & time for her annual CPX> Mia Hunter reports improved overall- states that she decided to stop the psychotropic meds from [facility] & Mia Hunter since they all seemed to have signif side effects (worse anxiety, incr agoraphobia, restless leg symptoms), noted she was feeling better off these meds; she states the agoraphobia is better, back working at [employer], etc; she does c/o some anxiety & Alpraz0.5mg  reallly helps, but also notes some depression & we discussed trial Fluoxetine20mg /d...  We reviewed the following medical problems during today's office visit >>     Ex-smoker> she quit for good in 2010; denies cough, sput, hemoptysis, SOB, etc...    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    Hx PMDD> followed by [name]; prev on Yaz & nuvaring but off all meds now; she has had a BTL in past...    Hx LBP> on Etodolac 400mg  Bid as needed...    Hx ?RLS> these symptoms pretty much resolved off the psychotopic meds...    Anxiety & Depression> she was diagnosed Bipolar disorder & agoraphobia w/ panic by Family services [name] & [name];  She has been tried on mult meds eg.Abilify and she states mult side effects like worse  anxiety, incr agoraphobia, & restless leg symptoms;  Pt decided to stop all these meds & just use the Alpraz prn=> she reports much improved overall... We reviewed prob list, meds, xrays and labs> see below for updates >> she declined the 2013 flu vaccine... LABS 1/14:  FLP- at goals on diet alone;  Chems- wnl;  CBC- wnl;  TSH=1.47   ~  July 22, 2013:  Mia Hunter returns for a 39mo ROV recheck>    Last visit we added Prozac20mg /d for depression at her request (see below); she tells me that she took it for about 39mo & didn't notice any improvement ("no better, no worse") so she stopped it; recall hx of Rx by Psychiatry in the past- [name] w/ numerous other meds; after discussion today we  decided to switch the SSRI to an SNRI (try EffexorXR 75mg Bid) but if she does not respond then she will need f/u w/ Psychiatry...    In addition Mia Hunter is c/o persistent constipation> she has BMs no more than once per week; we prev rec Miralax 1 capful once or twice daily, and Senakot-S 1-2 Qhs; she tells me she didn't take these meds but she's using Metamucil daily & got a generic Colase- without improvement; she did not seem motivated to take my prescribed regimen therefore we discussed Linzess 290mg /d; I told her if that didn't work we would need to refer her to GI for further eval...  We reviewed prob list, meds, xrays and labs> see below for updates >>            Problem List:    Ex- [employer] SMO[employer] (ICD-305.1) - smoked 1/2 ppd & was told to quit by GYN- had to stop the YAZ Rx at age 14...  denies cough, phlegm, SOB, CP, etc... ~  7/10:  discussed smoking cessation strategies including cessation programs, counselling, nicotine replacement, and Chantix receptor blockade... she declined help. ~  9/11:  now states that she has decided to quit & want to try Wellbutrin Rx & E-cigs, +rec to consider counselling (she declines), Nicotine replacement, etc... ~  9/12:  Pt states the Wellbutrin reacted on her & didn't tolerate; restarted Zoloft & quit smoking on her own she says, now back on YAZ per Gyn... ~  1/14:  She remains smoke free & asymptomatic...  IRRITABLE BOWEL SYNDROME (ICD-564.1), & CONSTIPATION (ICD-564.00) - Hx IBS w/ constipation predominent form... states BMs once weekly- firm to hard, sometimes w/ liq stool to follow... denies abd pain, N/V, blood seen, etc... prev eval by [name] w/ colonoscopy 2001= neg... ~  7/10:  we discussed Rx w/ Metamucil once or twice daily, fluids, etc > no better she says. ~  9/11:  advised to seek help of GI but she declines appt> therefore try combination MIRALAX, SENAKOT-S, & she likes cereal fiber bars... ~  9/12:  She reports improved & uses the  laxatives just as needed now... ~  1/14:  Encouraged to use the Miralax & Senakot-s more regularly for her constipation... ~  7/14:  She tells me she never tried the Miralax/ Senakot-S but is using Metamucil/ Colase but these are not working for her constip & she goes once per wk; Rec- Linzess 290mg /d, next step= refer to GI...  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 she will discuss further w/ DrHarvett soon... ~  She reports that she has ovarian cysts and  sm uterine fibroid being followed by [name]... ~  GYN also prescribes ZOVIRAX tabs & ointment for as needed use...  BACK PAIN, LUMBAR (ICD-724.2) - she uses ETODOLAC 400mg  Bid Prn...  C/O Ankle Edema & ?RLS >> presented 6/13 for add-on visit due to intermittent ankle edema & complaints of uncomfortable movement  In legs & arms (see above); we discussed NO SALT etc & trial MIRAPEX for the RLS (she reports this helped, but symptoms mostly resolved off the Abilify 7 psychotropics)...   ANXIETY (ICD-300.00), & DEPRESSION (ICD-311) - on ALPRAZOLAM 0.5mg  Tid Prn... she notes that she uses the Xanax mostly around her menses... ~  She was referred by Family services, [name] to [facility] & sees NP [name]> prescribed ABILIFY 15mg - 1/2 tab daily & pt says improved... ~  1/14:  she was diagnosed Bipolar disorder & agoraphobia w/ panic by Family services [name] & [name];  She has been tried on mult meds- eg. Abilify and she states mult side effects like worse anxiety, incr agoraphobia, & restless leg symptoms;  Pt decided to stop all these meds & just use the Alpraz prn=> she reports much improved overall... ~  7/14:  Last visit we added Prozac20mg /d for depression at her request (see below); she tells me that she took it for about 57mo & didn't notice any improvement ("no better, no worse") so she stopped it;  offered an SNRI trial & she agreed to [facility] 75mg Bid to see if this makes a diff for her; next step= refer back to Psyche for their Rx...  Hx of ANGIONEUROTIC EDEMA NOT ELSEWHERE CLASSIFIED (ICD-995.1) - evaluated in 1997 for urticaria & angioedema of ?etiology... nothing ever determined and symptoms resolved without recurrence... allergy testing by DrESL was neg- IgE level = 11...   HEALTH MAINTENANCE: she quit smoking 2010... ~  GI:  Followed by Dorris Singh, known IBS-c, had neg colonoscopy 2001... ~  GYN:  Followed by [facility] for gyn... ~  Immuniz:  She declines the flu shot;  ?when her last tetanus shot was given,     Past Surgical History  Procedure Laterality Date  . Hernia repair  10/2010    done by [facility]    Outpatient Encounter Prescriptions as of 07/22/2013  Medication Sig Dispense Refill  . acyclovir (ZOVIRAX) 400 MG tablet TAKE 1 TABLET BY MOUTH 4 TIMES A DAY AS NEEDED  100 tablet  11  . acyclovir (ZOVIRAX) 5 % ointment Apply 1 application topically every 3 (three) hours. As directed       . ALPRAZolam (XANAX) 0.5 MG tablet Take 1/2 to 1 tablet by mouth three times daily as needed for nerves  90 tablet  5  . etodolac (LODINE) 400 MG tablet Take 1 tablet (400 mg total) by mouth 2 (two) times daily. As needed for arthritis pain  60 tablet  11  . polyethylene glycol (MIRALAX / GLYCOLAX) packet Take 17 g by mouth 2 (two) times daily.        Marland Kitchen senna (SENOKOT) 8.6 MG tablet Take 2 tablets by mouth at bedtime.        . Linaclotide 290 MCG CAPS Take 1 tablet by mouth daily.  30 capsule  11  . venlafaxine XR (EFFEXOR-XR) 75 MG 24 hr capsule Take 1 capsule (75 mg total) by mouth 2 (two) times daily.  60 capsule  11  . [DISCONTINUED] FLUoxetine (PROZAC) 20 MG capsule Take 1 capsule (20 mg total) by mouth daily.  30 capsule  5  No facility-administered encounter medications on file as of 07/22/2013.    Allergies  Allergen Reactions  . Bupropion Hcl     REACTION: mood swings     Current Medications, Allergies, Past Medical History, Past Surgical History, Family History, and Social History were reviewed in [employer] record.    Review of Systems         The patient complains of depression and anxiety.  The patient denies fever, chills, sweats, anorexia, fatigue, weakness, malaise, weight loss, sleep disorder, blurring, diplopia, eye irritation, eye discharge, vision loss, eye pain, photophobia, earache, ear discharge, tinnitus, decreased hearing, nasal congestion, nosebleeds, sore throat, hoarseness, chest pain, palpitations, syncope, dyspnea on exertion, orthopnea, PND, peripheral edema, cough, dyspnea at rest, excessive sputum, hemoptysis, wheezing, pleurisy, nausea, vomiting, diarrhea, constipation, change in bowel habits, abdominal pain, melena, hematochezia, jaundice, gas/bloating, indigestion/heartburn, dysphagia, odynophagia, dysuria, hematuria, urinary frequency, urinary hesitancy, nocturia, incontinence, back pain, joint pain, joint swelling, muscle cramps, muscle weakness, stiffness, arthritis, sciatica, restless legs, leg pain at night, leg pain with exertion, rash, itching, dryness, suspicious lesions, paralysis, paresthesias, seizures, tremors, vertigo, transient blindness, frequent falls, frequent headaches, difficulty walking, memory loss, confusion, cold intolerance, heat intolerance, polydipsia, polyphagia, polyuria, unusual weight change, abnormal bruising, bleeding, enlarged lymph nodes, urticaria, allergic rash, hay fever, and recurrent infections.     Objective:   Physical Exam    WD, WN, 37 y/o WF in NAD... GENERAL:  Alert & oriented; pleasant & cooperative... HEENT:  [place]/AT, EOM-wnl, PERRLA, Fundi-benign, EACs-clear, TMs-wnl, NOSE-clear, THROAT-clear & wnl. NECK:  Supple w/ full ROM; no JVD; normal carotid impulses w/o bruits; no thyromegaly or nodules palpated; no lymphadenopathy. CHEST:  Clear to P & A; without wheezes/  rales/ or rhonchi. HEART:  Regular Rhythm; without murmurs/ rubs/ or gallops. ABDOMEN:  Soft & nontender; normal bowel sounds; no organomegaly or masses detected. EXT: without deformities or arthritic changes; no varicose veins/ venous insuffic/ or edema. NEURO:  CN's intact; motor testing normal; sensory testing normal; gait normal & balance OK. DERM:  No lesions noted; no rash etc...  RADIOLOGY DATA:  Reviewed in the EPIC EMR & discussed w/ the patient...  LABORATORY DATA:  Reviewed in the EPIC EMR & discussed w/ the patient...   Assessment & Plan:    IBS/ Constip>  Constip continues but she never used the Miralax/ Senakot-S & got Metamucil/ Colase instaed (not helping)=> discussed trial LINZESS 290mg /d, next step= refer to GI...  PMDD, Ovarian cysts, Uterine fibroid>  All followed by [name], GYN and off prev yaz & nuvaring...  Hx LBP>  She uses Lodine as needed; she notes that her LBP radiates to legs & esp around her menses...  Anxiety/ Psyche>  she uses the Alpraz as needed; states she is feeling better off the psychotropics but notes some anxiety/ panic symptoms and intermittent depression; trial Prozac20 was of no benefit & we will try SNRI EffexorXR 75mg  bid; next step= refer back to Psychiatry for their help...  Other medical issues as noted...   Patient's Medications  New Prescriptions   LINACLOTIDE 290 MCG CAPS    Take 1 tablet by mouth daily.   VENLAFAXINE XR (EFFEXOR-XR) 75 MG 24 HR CAPSULE    Take 1 capsule (75 mg total) by mouth 2 (two) times daily.  Previous Medications   ACYCLOVIR (ZOVIRAX) 400 MG TABLET    TAKE 1 TABLET BY MOUTH 4 TIMES A DAY AS NEEDED   ACYCLOVIR (ZOVIRAX) 5 % OINTMENT    Apply 1 application topically  every 3 (three) hours. As directed    ALPRAZOLAM (XANAX) 0.5 MG TABLET    Take 1/2 to 1 tablet by mouth three times daily as needed for nerves   ETODOLAC (LODINE) 400 MG TABLET    Take 1 tablet (400 mg total) by mouth 2 (two) times daily. As  needed for arthritis pain   POLYETHYLENE GLYCOL (MIRALAX / GLYCOLAX) PACKET    Take 17 g by mouth 2 (two) times daily.     SENNA (SENOKOT) 8.6 MG TABLET    Take 2 tablets by mouth at bedtime.    Modified Medications   No medications on file  Discontinued Medications   FLUOXETINE (PROZAC) 20 MG CAPSULE    Take 1 capsule (20 mg total) by mouth daily.

## 2013-07-22 NOTE — Patient Instructions (Addendum)
Today we updated your med list in our EPIC system...    Continue your current medications the same...  We decided to try one other med for your depression>>    Start EFFEXOR-XR (Venlafaxine) 75mg  twice daily...    The next step is to refer you back to [facility] for his input & suggestions...  For your Constipation>>     Try the new LINZESS 290mg  - one cap daily...    The next step is to refer you back to the gastroenterologist for their suggestions...  Call for any questions.Marland KitchenMarland Kitchen

## 2013-08-22 ENCOUNTER — Other Ambulatory Visit: Payer: Self-pay | Admitting: Pulmonary Disease

## 2013-08-27 ENCOUNTER — Telehealth: Payer: Self-pay | Admitting: Pulmonary Disease

## 2013-08-27 NOTE — Telephone Encounter (Signed)
[facility] was [facility] called into [facility]. [facility] confirmed with [facility] do [facility]  lmtcb x1 for pt

## 2013-08-27 NOTE — Telephone Encounter (Signed)
Pt returned phone call.   Advised Rx was sent to [facility] & that [facility] was called & confirmed that they did receive the Rx.  Pt verbalized understanding & states nothing further needed at this time.  Antionette Fairy

## 2013-11-16 LAB — HM PAP SMEAR: HM Pap smear: NEGATIVE

## 2014-01-27 ENCOUNTER — Ambulatory Visit (INDEPENDENT_AMBULATORY_CARE_PROVIDER_SITE_OTHER)
Admission: RE | Admit: 2014-01-27 | Discharge: 2014-01-27 | Disposition: A | Discharge status: Home / Self Care | Payer: BC Managed Care – PPO | Source: Ambulatory Visit | Attending: Pulmonary Disease | Admitting: Pulmonary Disease

## 2014-01-27 ENCOUNTER — Encounter: Payer: Self-pay | Admitting: Pulmonary Disease

## 2014-01-27 ENCOUNTER — Ambulatory Visit (INDEPENDENT_AMBULATORY_CARE_PROVIDER_SITE_OTHER): Payer: BC Managed Care – PPO | Admitting: Pulmonary Disease

## 2014-01-27 VITALS — BP 126/84 | HR 70 | Temp 98.3°F | Ht 65.0 in | Wt 181.6 lb

## 2014-01-27 DIAGNOSIS — F411 Generalized anxiety disorder: Secondary | ICD-10-CM

## 2014-01-27 DIAGNOSIS — R0789 Other chest pain: Secondary | ICD-10-CM

## 2014-01-27 DIAGNOSIS — R071 Chest pain on breathing: Secondary | ICD-10-CM

## 2014-01-27 DIAGNOSIS — N943 Premenstrual tension syndrome: Secondary | ICD-10-CM

## 2014-01-27 DIAGNOSIS — K589 Irritable bowel syndrome without diarrhea: Secondary | ICD-10-CM

## 2014-01-27 DIAGNOSIS — K5900 Constipation, unspecified: Secondary | ICD-10-CM

## 2014-01-27 NOTE — Progress Notes (Signed)
Subjective:    Patient ID: Mia Hunter, female    DOB: 05-02-1976, 38 y.o.   MRN: [id number]  HPI 38 y/o WF here for a follow up visit, she is the daughter of [name]...  ~  June 06, 2012:  53mo ROV & add-on appt> her CC is intermittent ankle swelling that tends to be worse late in the day & resolve overnight, intermittent not present every day maybe 2-3 days per week==> we discussed the role of Sodium, low salt diet, elevation, support hose (she does not need diuretic)...  Also c/o legs aching & feels like she has to move them all the time to get comfortable; symptom present all day but worse Qhs, affects arms too==> we discussed RLS & rec trial MIRAPEX 0.125mg  starting 1/2 Bid & incr to 1Bid (she will check w/ Psychiatry to be sure her Abilify isn't a culprit)...  Also notes some dizziness but she thinks it's due to stress & all that she's been going thru recently...    She has been seeing Psychologist/MSW [name] from [facility] referred to Psychiatry [name] & sees NP [name]> started on ABILIFY which she feels is helping...   We reviewed prob list, meds, xrays and labs> see below>>  ~  January 21, 2013:  40mo ROV & time for her annual CPX> [name] reports improved overall- states that she decided to stop the psychotropic meds from [facility] & [name] since they all seemed to have signif side effects (worse anxiety, incr agoraphobia, restless leg symptoms), noted she was feeling better off these meds; she states the agoraphobia is better, back working at [employer], etc; she does c/o some anxiety & Alpraz0.5mg  reallly helps, but also notes some depression & we discussed trial Fluoxetine20mg /d...  We reviewed the following medical problems during today's office visit >>     Ex-smoker> she quit for good in 2010; denies cough, sput, hemoptysis, SOB, etc...    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    Hx PMDD>  followed by [name]; prev on Yaz & nuvaring but off all meds now; she has had a BTL in past...    Hx LBP> on Etodolac 400mg  Bid as needed...    Hx ?RLS> these symptoms pretty much resolved off the psychotopic meds...    Anxiety & Depression> she was diagnosed Bipolar disorder & agoraphobia w/ panic by Family services [name] & [name];  She has been tried on mult meds eg.Abilify and she states mult side effects like worse anxiety, incr agoraphobia, & restless leg symptoms;  Pt decided to stop all these meds & just use the Alpraz prn=> she reports much improved overall... We reviewed prob list, meds, xrays and labs> see below for updates >> she declined the 2013 flu vaccine...  LABS 1/14:  FLP- at goals on diet alone;  Chems- wnl;  CBC- wnl;  TSH=1.47   ~  July 22, 2013:  Anet returns for a 40mo ROV recheck>    Last visit we added Prozac20mg /d for depression at her request (see below); she tells me that she took it for about 38mo & didn't notice any improvement ("no better, no worse") so she stopped it; recall hx of Rx by Psychiatry in the past- [name] w/ numerous other meds; after discussion today we decided to switch the SSRI to an SNRI (try EffexorXR 75mg Bid) but if she does not respond then she will need f/u w/ Psychiatry.Marland KitchenMarland Kitchen  In addition Clarene is c/o persistent constipation> she has BMs no more than once per week; we prev rec Miralax 1 capful once or twice daily, and Senakot-S 1-2 Qhs; she tells me she didn't take these meds but she's using Metamucil daily & got a generic Colase- without improvement; she did not seem motivated to take my prescribed regimen therefore we discussed Linzess 290mg /d; I told her if that didn't work we would need to refer her to GI for further eval...  We reviewed prob list, meds, xrays and labs> see below for updates >>   ~  January 27, 2014:  40mo ROV & Ica notes that her constipation improved w/ Linzess but it made her stomach hurt so she stopped & uses it prn  (try the Linzess145); also c/o epig pain/ burning & rec to take OTC Prilosec20... We reviewed the following medical problems during today's office visit >>     Ex-smoker> she quit for good in 2010; denies cough, sput, hemoptysis, SOB, etc...    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     Hx PMDD> followed by [name]; prev on Yaz & nuvaring but off all meds now; she has had a BTL in past...    Hx LBP> on Etodolac 400mg  Bid as needed...    Hx ?RLS> these symptoms pretty much resolved off the psychotopic meds...    Anxiety & Depression> she was diagnosed Bipolar disorder & agoraphobia w/ panic by [facility] & [name];  She has been tried on mult meds eg.Abilify and she states mult side effects like worse anxiety, incr agoraphobia, & restless leg symptoms;  Pt decided to stop all these meds & just use the Alpraz prn=> she reports much improved overall... We reviewed prob list, meds, xrays and labs> see below for updates >> she refuses the seasonal flu shots...   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  denies cough, phlegm, SOB, CP, etc... ~  7/10:  discussed smoking cessation strategies including cessation programs, counselling, nicotine replacement, and Chantix receptor blockade... she declined help. ~  9/11:  now states that she has decided to quit & want to try Wellbutrin Rx & E-cigs, +rec to consider counselling (she declines), Nicotine replacement, etc... ~  9/12:  Pt states the Wellbutrin reacted on her & didn't tolerate; restarted Zoloft & quit smoking on her own she says, now back on YAZ per Gyn... ~  CXR 9/12 showed norm heart size, clear lungs, NAD...  ~  EKG 9/12 showed NSR, rate62, wnl, NAD...  ~  1/14:  She remains smoke free &  asymptomatic...  IRRITABLE BOWEL SYNDROME (ICD-564.1), & CONSTIPATION (ICD-564.00) - Hx IBS w/ constipation predominent form... states BMs once weekly- firm to hard, sometimes w/ liq stool to follow... denies abd pain, N/V, blood seen, etc... prev eval by [name] w/ colonoscopy 2001= neg... ~  7/10:  we discussed Rx w/ Metamucil once or twice daily, fluids, etc > no better she says. ~  9/11:  advised to seek help of GI but she declines appt> therefore try combination MIRALAX, SENAKOT-S, & she likes cereal fiber bars... ~  9/12:  She reports improved & uses the  laxatives just as needed now... ~  1/14:  Encouraged to use the Miralax & Senakot-s more regularly for her constipation... ~  7/14:  She tells me she never tried the Miralax/ Senakot-S but is using Metamucil/ Colase but these are not working for her constip & she goes once per wk; Rec- Linzess 290mg /d, next step= refer to GI...  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 she will discuss further w/ DrHarvett soon... ~  She reports that she has ovarian cysts and sm uterine fibroid being followed by [name]... ~  GYN also prescribes ZOVIRAX tabs & ointment for as needed use...  BACK PAIN, LUMBAR (ICD-724.2) - she uses ETODOLAC 400mg  Bid Prn...  C/O Ankle Edema & ?RLS >> presented 6/13 for add-on visit due to intermittent ankle edema & complaints of uncomfortable movement  In legs & arms (see above); we discussed NO SALT etc & trial MIRAPEX for the RLS (she reports this helped, but symptoms mostly resolved off the Abilify 7 psychotropics)...   ANXIETY (ICD-300.00), & DEPRESSION (ICD-311) - on ALPRAZOLAM 0.5mg  Tid Prn... she notes that she uses the Xanax mostly around her menses... ~  She was referred by Family services, [name] to [facility] & sees NP [name]> prescribed ABILIFY 15mg - 1/2 tab daily & pt says  improved... ~  1/14:  she was diagnosed Bipolar disorder & agoraphobia w/ panic by Family services [name] & [name];  She has been tried on mult meds- eg. Abilify and she states mult side effects like worse anxiety, incr agoraphobia, & restless leg symptoms;  Pt decided to stop all these meds & just use the Alpraz prn=> she reports much improved overall... ~  7/14:  Last visit we added Prozac20mg /d for depression at her request (see below); she tells me that she took it for about 55mo & didn't notice any improvement ("no better, no worse") so she stopped it; offered an SNRI trial & she agreed to [facility] 75mg Bid to see if this makes a diff for her; next step= refer back to Psyche for their Rx...  Hx of ANGIONEUROTIC EDEMA NOT ELSEWHERE CLASSIFIED (ICD-995.1) - evaluated in 1997 for urticaria & angioedema of ?etiology... nothing ever determined and symptoms resolved without recurrence... allergy testing by DrESL was neg- IgE level = 11...   HEALTH MAINTENANCE: she quit smoking 2010... ~  GI:  Followed by Dorris Singh, known IBS-c, had neg colonoscopy 2001... ~  GYN:  Follow[facility] gyn... ~  Immuniz:  She declines the flu shot;  ?when her last tetanus shot was given,     Past Surgical History  Procedure Laterality Date  . Hernia repair  10/2010    done by [facility]    Outpatient Encounter Prescriptions as of 01/27/2014  Medication Sig  . acyclovir (ZOVIRAX) 400 MG tablet TAKE 1 TABLET BY MOUTH 4 TIMES A DAY AS NEEDED  . acyclovir (ZOVIRAX) 5 % ointment Apply 1 application topically every 3 (three) hours. As directed   . ALPRAZolam (XANAX) 0.5 MG tablet TAKE 1/2 TO 1 TABLET BY MOUTH 3 TIMES A DAY AS NEEDED FOR NERVES  . etodolac (LODINE) 400 MG tablet Take 1 tablet (400 mg total) by mouth 2 (two) times daily. As needed for arthritis pain  . Linaclotide 290 MCG CAPS Take 1 tablet by mouth daily.  . polyethylene glycol (MIRALAX / GLYCOLAX) packet Take 17 g by  mouth 2 (two) times daily.    Marland Kitchen  senna (SENOKOT) 8.6 MG tablet Take 2 tablets by mouth at bedtime.    Marland Kitchen venlafaxine XR (EFFEXOR-XR) 75 MG 24 hr capsule Take 1 capsule (75 mg total) by mouth 2 (two) times daily.    Allergies  Allergen Reactions  . Bupropion Hcl     REACTION: mood swings    Current Medications, Allergies, Past Medical History, Past Surgical History, Family History, and Social History were reviewed in [employer] record.    Review of Systems         The patient complains of depression and anxiety.  The patient denies fever, chills, sweats, anorexia, fatigue, weakness, malaise, weight loss, sleep disorder, blurring, diplopia, eye irritation, eye discharge, vision loss, eye pain, photophobia, earache, ear discharge, tinnitus, decreased hearing, nasal congestion, nosebleeds, sore throat, hoarseness, chest pain, palpitations, syncope, dyspnea on exertion, orthopnea, PND, peripheral edema, cough, dyspnea at rest, excessive sputum, hemoptysis, wheezing, pleurisy, nausea, vomiting, diarrhea, constipation, change in bowel habits, abdominal pain, melena, hematochezia, jaundice, gas/bloating, indigestion/heartburn, dysphagia, odynophagia, dysuria, hematuria, urinary frequency, urinary hesitancy, nocturia, incontinence, back pain, joint pain, joint swelling, muscle cramps, muscle weakness, stiffness, arthritis, sciatica, restless legs, leg pain at night, leg pain with exertion, rash, itching, dryness, suspicious lesions, paralysis, paresthesias, seizures, tremors, vertigo, transient blindness, frequent falls, frequent headaches, difficulty walking, memory loss, confusion, cold intolerance, heat intolerance, polydipsia, polyphagia, polyuria, unusual weight change, abnormal bruising, bleeding, enlarged lymph nodes, urticaria, allergic rash, hay fever, and recurrent infections.     Objective:   Physical Exam    WD, WN, 38 y/o WF in NAD... GENERAL:  Alert & oriented; pleasant & cooperative... HEENT:   Ullin/AT, EOM-wnl, PERRLA, Fundi-benign, EACs-clear, TMs-wnl, NOSE-clear, THROAT-clear & wnl. NECK:  Supple w/ full ROM; no JVD; normal carotid impulses w/o bruits; no thyromegaly or nodules palpated; no lymphadenopathy. CHEST:  Clear to P & A; without wheezes/ rales/ or rhonchi. HEART:  Regular Rhythm; without murmurs/ rubs/ or gallops. ABDOMEN:  Soft & nontender; normal bowel sounds; no organomegaly or masses detected. EXT: without deformities or arthritic changes; no varicose veins/ venous insuffic/ or edema. NEURO:  CN's intact; motor testing normal; sensory testing normal; gait normal & balance OK. DERM:  No lesions noted; no rash etc...  RADIOLOGY DATA:  Reviewed in the EPIC EMR & discussed w/ the patient...  LABORATORY DATA:  Reviewed in the EPIC EMR & discussed w/ the patient...   Assessment & Plan:    IBS/ Constip>  Constip continues but she never used the Miralax/ Senakot-S => discussed trial LINZESS 145mg /d, as the 290mg  dose caused abd discomfort.  PMDD, Ovarian cysts, Uterine fibroid>  All followed by [name], GYN and off prev yaz & nuvaring...  Hx LBP>  She uses Lodine as needed; she notes that her LBP radiates to legs & esp around her menses...  Anxiety/ Psyche>  she uses the Alpraz as needed; states she is feeling better off the psychotropics but notes some anxiety/ panic symptoms and intermittent depression; trial Prozac20 was of no benefit, as was SNRI EffexorXR 75mg  bid; next step= refer back to Psychiatry for their help...  Other medical issues as noted...   Patient's Medications  New Prescriptions   No medications on file  Previous Medications   ACYCLOVIR (ZOVIRAX) 400 MG TABLET    TAKE 1 TABLET BY MOUTH 4 TIMES A DAY AS NEEDED   ACYCLOVIR (ZOVIRAX) 5 % OINTMENT    Apply 1 application topically every 3 (three) hours.  As directed    ETODOLAC (LODINE) 400 MG TABLET    Take 1 tablet (400 mg total) by mouth 2 (two) times daily. As needed for arthritis pain    LINACLOTIDE (LINZESS) 145 MCG CAPS CAPSULE    Take 145 mcg by mouth daily.   POLYETHYLENE GLYCOL (MIRALAX / GLYCOLAX) PACKET    Take 17 g by mouth 2 (two) times daily.     SENNA (SENOKOT) 8.6 MG TABLET    Take 2 tablets by mouth at bedtime.    Modified Medications   Modified Medication Previous Medication   ALPRAZOLAM (XANAX) 0.5 MG TABLET ALPRAZolam (XANAX) 0.5 MG tablet      TAKE 1/2 TO 1 TABLET BY MOUTH 3 TIMES A DAY AS NEEDED FOR NERVES    TAKE 1/2 TO 1 TABLET BY MOUTH 3 TIMES A DAY AS NEEDED FOR NERVES  Discontinued Medications   LINACLOTIDE 290 MCG CAPS    Take 1 tablet by mouth daily.   VENLAFAXINE XR (EFFEXOR-XR) 75 MG 24 HR CAPSULE    Take 1 capsule (75 mg total) by mouth 2 (two) times daily.

## 2014-01-27 NOTE — Patient Instructions (Signed)
Today we updated your med list in our EPIC system...    Continue your current medications the same...  We decided to decrease the LINZESS to 145mg  daily to see if this is better tolerated and improves your chronic constipation...  For your CHEST WALL PAIN>    We did a f/u CXR to be sure it is OK (wwe will call w/ this result)...    REST THE CHEST- no heavy lifting , bending, etc...    APPLY HEAT TO THE AREA...    Try the Etodolac (anti-inflamm), Tylenol, Alprazolam (musc relaxer), etc...  Call for any questions.Marland Kitchen..Marland Kitchen

## 2014-01-29 ENCOUNTER — Telehealth: Payer: Self-pay | Admitting: Pulmonary Disease

## 2014-01-29 NOTE — Telephone Encounter (Signed)
Notes Recorded by Michele McalpineScott M Nadel, MD on 01/28/2014 at 4:42 PM Please notify patient>  CXR is clear, heart size is wnl, NAD.Marland Kitchen.Marland Kitchen. ---  lmtcb x1

## 2014-01-29 NOTE — Telephone Encounter (Signed)
Pt called back and she is aware of cxr results per SN.  Nothing further is needed.

## 2014-04-02 ENCOUNTER — Other Ambulatory Visit: Payer: Self-pay | Admitting: Pulmonary Disease

## 2014-12-13 ENCOUNTER — Telehealth: Payer: Self-pay | Admitting: Pulmonary Disease

## 2014-12-13 NOTE — Telephone Encounter (Signed)
Called and spoke with pt and she is aware of appt that has been scheduled for pt to see SN 1-8 at 10.   Nothing further is needed.

## 2014-12-13 NOTE — Telephone Encounter (Signed)
Pt is requesting to speak with [name] only-would not give any information. [name] please advise .thanks.

## 2014-12-31 ENCOUNTER — Encounter: Payer: Self-pay | Admitting: Pulmonary Disease

## 2014-12-31 ENCOUNTER — Encounter (INDEPENDENT_AMBULATORY_CARE_PROVIDER_SITE_OTHER): Payer: Self-pay

## 2014-12-31 ENCOUNTER — Ambulatory Visit (INDEPENDENT_AMBULATORY_CARE_PROVIDER_SITE_OTHER): Payer: BLUE CROSS/BLUE SHIELD | Admitting: Pulmonary Disease

## 2014-12-31 VITALS — BP 128/80 | HR 71 | Temp 97.0°F | Ht 65.0 in | Wt 171.1 lb

## 2014-12-31 DIAGNOSIS — K5901 Slow transit constipation: Secondary | ICD-10-CM

## 2014-12-31 DIAGNOSIS — Z0000 Encounter for general adult medical examination without abnormal findings: Secondary | ICD-10-CM

## 2014-12-31 DIAGNOSIS — N943 Premenstrual tension syndrome: Secondary | ICD-10-CM

## 2014-12-31 DIAGNOSIS — F411 Generalized anxiety disorder: Secondary | ICD-10-CM

## 2014-12-31 DIAGNOSIS — K589 Irritable bowel syndrome without diarrhea: Secondary | ICD-10-CM

## 2014-12-31 MED ORDER — ALPRAZOLAM 0.5 MG PO TABS: ORAL_TABLET | ORAL | Status: DC

## 2014-12-31 MED ORDER — PANTOPRAZOLE SODIUM 40 MG PO TBEC: 40.0000 mg | DELAYED_RELEASE_TABLET | Freq: Every day | ORAL | Status: DC

## 2014-12-31 MED ORDER — ETODOLAC 400 MG PO TABS: 400.0000 mg | ORAL_TABLET | Freq: Two times a day (BID) | ORAL | Status: DC

## 2014-12-31 NOTE — Patient Instructions (Signed)
Today we updated your med list in our EPIC system...    Continue your current medications the same...  We decided to try AMITIZA 8mg - take 1 tab daily of the samples given...    If this works on your constpation and is well tolerated- call us for a prescription...    If not effective then let us know when you are ready to see the gastroenterologist for additional evaluation...  We also wrote for PROTONIX 40mg  (Pantoprazole) for the stomach acid- take one tab 30 mion before the 1st meal of the day...  Please return to our lab one morning next week for your FASTING blood work...    We will contact you w/ the results when available...   Good luck w/ the upcoming hysterectomy...  Call for any questions...  Let's plan a follow up visit in [id number], sooner if needed for problems.Marland Kitchen..Marland Kitchen

## 2014-12-31 NOTE — Progress Notes (Addendum)
Subjective:    Patient ID: Mia Hunter, female    DOB: 26-Nov-1976, 39 y.o.   MRN: [id number]  HPI 39 y/o WF here for a follow up visit, she is the daughter of Mia Hunter... ~  SEE PREV EPIC NOTES FOR OLDER DATA >>   ~  January 21, 2013:  41moROV & time for her annual CPX> Mia Hunter reports improved overall- states that she decided to stop the psychotropic meds from DVenangosince they all seemed to have signif side effects (worse anxiety, incr agoraphobia, restless leg symptoms), noted she was feeling better off these meds; she states the agoraphobia is better, back working at [employer] etc; she does c/o some anxiety & Alpraz0.590mreallly helps, but also notes some depression & we discussed trial Fluoxetine2043m...  We reviewed the following medical problems during today's office visit >>     Ex-smoker> she quit for good in 2010; denies cough, sput, hemoptysis, SOB, etc...    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    Hx PMDD> followed by DrHBeverley Fiedlerrev on Yaz & nuvaring but off all meds now; she has had a BTL in past...    Hx LBP> on Etodolac 400m25md as needed...    Hx ?RLS> these symptoms pretty much resolved off the psychotopic meds...    Anxiety & Depression> she was diagnosed Bipolar disorder & agoraphobia w/ panic by Family services [name] & JaneJessica Priesthe has been tried on mult meds eg.Abilify and she states mult side effects like worse anxiety, incr agoraphobia, & restless leg symptoms;  Pt decided to stop all these meds & just use the Alpraz prn=> she reports much improved overall... We reviewed prob list, meds, xrays and labs> see below for updates >> she declined the 2013 flu vaccine...  LABS 1/14:  FLP- at goals on diet alone;  Chems- wnl;  CBC- wnl;  TSH=1.47   ~  July 22, 2013:  Laruen returns for a 80mo 30morecheck>    Last visit we added Prozac20mg/66mr depression at her request (see below);  she tells me that she took it for about 57mo & 657mo't notice any improvement ("no better, no worse") so she stopped it; recall hx of Rx by Psychiatry in the past- [name] w/ numerous other meds; after discussion today we decided to switch the SSRI to an SNRI (try EffexorXR 75mgBid50mt if she does not respond then she will need f/u w/ Psychiatry...    In addition Mia Hunter is c/o persistent constipation> she has BMs no more than once per week; we prev rec Miralax 1 capful once or twice daily, and Senakot-S 1-2 Qhs; she tells me she didn't take these meds but she's using Metamucil daily & got a generic Colase- without improvement; she did not seem motivated to take my prescribed regimen therefore we discussed Linzess 290mg/d; 66mld her if that didn't work we would need to refer her to GI for further eval...  We reviewed prob list, meds, xrays and labs> see below for updates >>   ~  January 27, 2014:  80mo ROV &47moci notes that her constipation improved w/ Linzess but it made her stomach hurt so she stopped & uses it prn (try the Linzess145); also c/o epig pain/ burning & rec to take OTC Prilosec20... We reviewed the following medical problems during today's office visit >>     Ex-smoker> she quit for good  in 2010; denies cough, sput, hemoptysis, SOB, etc...    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     Hx PMDD> followed by Beverley Fiedler; prev on Yaz & nuvaring but off all meds now; she has had a BTL in past...    Hx LBP> on Etodolac 463m Bid as needed...    Hx ?RLS> these symptoms pretty much resolved off the psychotopic meds...    Anxiety & Depression> she [facility]/ [facility] JJ[facility]ica Priest  She has been tried on mult meds eg.Abilify and she states mult side effects like worse anxiety, incr agoraphobia, & restless leg symptoms;  Pt decided to stop all these meds & just use the  Alpraz prn=> she reports much improved overall... We reviewed prob list, meds, xrays and labs> see below for updates >> she refuses the seasonal flu shots...   CXR 2/15 showed norm heart size, clear lungs, NAD...   ~  December 31, 2014:  146moOV & Mia Hunter presents w/ continued GI issues> c/o severe acid reflux and chronic constipation; she has been taking OTC PPI meds but they cost her $50 per month & she wonders if there is a cheaper prescription option (we discussed Pantoprazole40 once or twice daily); she tried Linzess last yr for the constipation but both doses caused adb discomfort & cramping- we reviewed IBS-c symptoms and offered additional meds vs GI eval but she declined; she still goes once once or twice per week but denies abd distention, n/v, etc; she has tried MiNewmont Miningut is vague about their efficacy; I gave her samples of AMITIZA 64m72maily & she will call for Rx if helpful... We reviewed the following medical problems during today's office visit >>     Ex-smoker> she quit for good in 2010; denies cough, sput, hemoptysis, SOB, etc; chest is clear, no resp infections etc...    IBS-C> as above- she has declined GI referral for further eval; I have rec MIRALAX daily & SENAKOT-S 2 Qhs; she has been intol to Linzess in both doses due to abd discomfort she says- try AMITIZA 64mg30msamples & she will call us..Mia Hunter   Hx PMDD> followed by DrHaBeverley Fiedlerev on Yaz [place] off all meds now; she has had a BTL in past & she tells me she is sched for a hysteresctomy by DrRichardson due to fibroids; she is hopeful that the surg will help all symptoms.     Hx LBP> on Etodolac 400mg7m as needed & doing satis she says...    Hx ?RLS> these symptoms pretty much resolved off the psychotopic meds...    Anxiety & Depression, ?Bipolar, ?Adult ADD> she was diagnosed Bipolar & agoraphobic w/ panic by [facility]ug[facility] Prieste has been tried on mult meds but she states mult side effects like worse  anxiety, incr agoraphobia, restless leg symptoms;  She is now [facility], started on Vyvance40 plus her Alpraz0.5Tid prn...  We reviewed prob list, meds, xrays and labs> see below for updates >>   LABS 1/16: FLP- wnl on diet alone;  Chems- all wnl;  CBC- wnl;  TSH=3.85... PLAN>> she will ret for FASTING blood work (all WNL), continue same meds but try regular dosing of Miralax (once daily) & Senakot-S (2Qhs); we gave her 3 wks  of Amitiza32m to take one daily & she will call for Rx if helpful; otherwise I have encouraged GI referral for further eval & she will consider this; she is hopeful that the planned hysterectomy will improve her chr constipation...            Problem List:    Ex- CIGARETTE SMOKER (ICD-305.1) - smoked 1/2 ppd & was told to quit by GYN- had to stop the YAZ Rx at age 39..  denies cough, phlegm, SOB, CP, etc... ~  7/10:  discussed smoking cessation strategies including cessation programs, counselling, nicotine replacement, and Chantix receptor blockade... she declined help. ~  9/11:  now states that she has decided to quit & want to try Wellbutrin Rx & E-cigs, +rec to consider counselling (she declines), Nicotine replacement, etc... ~  9/12:  Pt states the Wellbutrin reacted on her & didn't tolerate; restarted Zoloft & quit smoking on her own she says, now back on YAZ per Gyn... ~  CXR 9/12 showed norm heart size, clear lungs, NAD...  ~  EKG 9/12 showed NSR, rate62, wnl, NAD...  ~  1/16:  She remains smoke free & asymptomatic...  GERD >>  1/16>  She has been taking OTC PPI meds but they are too $$$ she says; offered PANTOPRAZOLE 414m1-2 daily and rec to have further GI eval but she wants to wait...  IRRITABLE BOWEL SYNDROME (ICD-564.1), & CONSTIPATION (ICD-564.00) - Hx IBS w/ constipation predominent form... states BMs once weekly- firm to hard, sometimes w/ liq stool to follow... denies abd pain, N/V, blood seen, etc...  prev eval by [name] w/ colonoscopy 2001= neg... ~  7/10:  we discussed Rx w/ Metamucil once or twice daily, fluids, etc > no better she says. ~  9/11:  advised to seek help of GI but she declines appt> therefore try combination MIRALAX, SENAKOT-S, & she likes cereal fiber bars... ~  9/12:  She reports improved & uses the laxatives just as needed now... ~  1/14:  Encouraged to use the Miralax & Senakot-s more regularly for her constipation... ~  7/14:  She tells me she never tried the Miralax/ Senakot-S but is using Metamucil/ Colase but these are not working for her constip & she goes once per wk; Rec- Linzess 29011m, next step= refer to GI... ~  2/15:  The Linzess was too strong & caused abd cramping; Rec to try Linzess 145 daily & f/u w/ GI... ~  1/16:  She states that both doses of Linzess caused abd pain/ cramping & she stopped it; tried Miralax/ Senakot-S but vague about response; asked to restart these regularly, try AMITIZA 8mg86msamples & needs f/u w/ GI...  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 she will discuss further w/ DrHarvett soon... ~  She reports that she has ovarian cysts and sm uterine fibroid being followed by [name]... ~  GYN also prescribes ZOVIRAX tabs & ointment for as needed use... ~  1/16:  She tells me that DrRichardson is going to do a hysterectomy soon (Fibroids); she is hopefull that all symptoms and her constipation will improve post-op...  BACK PAIN, LUMBAR (ICD-724.2) - she uses ETODOLAC 400mg49m Prn...  C/O Ankle Edema & ?RLS >> presented 6/13 for add-on visit due to intermittent ankle edema & complaints of uncomfortable movement  In legs & arms (see above); we discussed NO SALT etc &  trial MIRAPEX for the RLS (she reports this helped, but symptoms mostly resolved off the Abilify & psychotropics)...   ANXIETY (ICD-300.00), & DEPRESSION  (ICD-311) - on ALPRAZOLAM 0.72m Tid Prn... she notes that she uses the Xanax mostly around her menses... ~  She was referred by Family services, [facility] & sees NP LMagda PaganiniO'Neal> prescribed ABILIFY 161m 1/2 tab daily & pt says improved... ~  1/14:  she was diagnosed Bipolar disorder & agoraphobia w/ panic by Family services [name] & JaJessica Priest She has been tried on mult meds- eg. Abilify and she states mult side effects like worse anxiety, incr agoraphobia, & restless leg symptoms;  Pt decided to stop all these meds & just use the Alpraz prn=> she reports much improved overall... ~  7/14:  Last visit we added Prozac2066m for depression at her request (see below); she tells me that she took it for about 69mo669moidn't notice any improvement ("no better, no worse") so she stopped it; offered an SNRI trial & she agreed to [facility] see if this makes a diff for her; next step= refer back to Psyche for their Rx... ~  1/16:  She tells me that she [facility], now on Vyvance & uses the alprazolam ~2-3 times weekly...  Hx of ANGIONEUROTIC EDEMA NOT ELSEWHERE CLASSIFIED (ICD-995.1) - evaluated in 1997 for urticaria & angioedema of ?etiology... nothing ever determined and symptoms resolved without recurrence... allergy testing by DrESL was neg- IgE level = 11...   HEALTH MAINTENANCE: she quit smoking 2010... ~  GI:  Followed by DrKapDemetra Shinerwn IBS-c, had neg colonoscopy 2001 & needs follow up eval w/ GI... ~  GYN:  [facility] & sched for hysterectomy in 2016. ~  Immuniz:  She declines the flu shot;  ?when her last tetanus shot was given...    Past Surgical History  Procedure Laterality Date  . Hernia repair  10/2010    done by [facility]    Outpatient Encounter Prescriptions as of 12/31/2014  Medication Sig  . acyclovir (ZOVIRAX) 400 MG tablet TAKE 1 TABLET BY MOUTH 4 TIMES A DAY AS NEEDED  .  acyclovir (ZOVIRAX) 5 % ointment Apply 1 application topically every 3 (three) hours. As directed   . ALPRAZolam (XANAX) 0.5 MG tablet TAKE 1/2 TO 1 TABLET BY MOUTH 3 TIMES A DAY AS NEEDED FOR NERVES  . etodolac (LODINE) 400 MG tablet Take 1 tablet (400 mg total) by mouth 2 (two) times daily. As needed for arthritis pain  . Linaclotide (LINZESS) 145 MCG CAPS capsule Take 145 mcg by mouth daily.  . polyethylene glycol (MIRALAX / GLYCOLAX) packet Take 17 g by mouth 2 (two) times daily.    . senMarland Kitchena (SENOKOT) 8.6 MG tablet Take 2 tablets by mouth at bedtime.      Allergies  Allergen Reactions  . Bupropion Hcl     REACTION: mood swings    Current Medications, Allergies, Past Medical History, Past Surgical History, Family History, and Social History were reviewed in [employer].    Review of Systems         The patient complains of depression and anxiety; reflux and constipation.  The patient denies fever, chills, sweats, anorexia, fatigue, weakness, malaise, weight loss, sleep disorder, blurring, diplopia, eye irritation, eye discharge, vision loss, eye pain, photophobia, earache, ear discharge, tinnitus, decreased hearing, nasal congestion, nosebleeds, sore  throat, hoarseness, chest pain, palpitations, syncope, dyspnea on exertion, orthopnea, PND, peripheral edema, cough, dyspnea at rest, excessive sputum, hemoptysis, wheezing, pleurisy, nausea, vomiting, diarrhea, change in bowel habits, melena, hematochezia, jaundice, gas/bloating, indigestion/heartburn, dysphagia, odynophagia, dysuria, hematuria, urinary frequency, urinary hesitancy, nocturia, incontinence, back pain, joint pain, joint swelling, muscle cramps, muscle weakness, stiffness, arthritis, sciatica, restless legs, leg pain at night, leg pain with exertion, rash, itching, dryness, suspicious lesions, paralysis, paresthesias, seizures, tremors, vertigo, transient blindness, frequent falls, frequent headaches,  difficulty walking, memory loss, confusion, cold intolerance, heat intolerance, polydipsia, polyphagia, polyuria, unusual weight change, abnormal bruising, bleeding, enlarged lymph nodes, urticaria, allergic rash, hay fever, and recurrent infections.     Objective:   Physical Exam    WD, WN, 39 y/o WF in NAD... GENERAL:  Alert & oriented; pleasant & cooperative... HEENT:  [place]/AT, EOM-wnl, PERRLA, Fundi-benign, EACs-clear, TMs-wnl, NOSE-clear, THROAT-clear & wnl. NECK:  Supple w/ full ROM; no JVD; normal carotid impulses w/o bruits; no thyromegaly or nodules palpated; no lymphadenopathy. CHEST:  Clear to P & A; without wheezes/ rales/ or rhonchi. HEART:  Regular Rhythm; without murmurs/ rubs/ or gallops. ABDOMEN:  Soft & nontender; normal bowel sounds; no organomegaly or masses detected. EXT: without deformities or arthritic changes; no varicose veins/ venous insuffic/ or edema. NEURO:  CN's intact; motor testing normal; sensory testing normal; gait normal & balance OK. DERM:  No lesions noted; no rash etc...  RADIOLOGY DATA:  Reviewed in the EPIC EMR & discussed w/ the patient...  LABORATORY DATA:  Reviewed in the EPIC EMR & discussed w/ the patient...   Assessment & Plan:    GERD>  She is c/o persistent GERD/ reflux & using OTC meds but too $$; we discussed trial PANTOPRAZOLE 44m up to Bid and f/u eval by GI...  IBS/ Constip>  Constip continues but she never used the Miralax/ Senakot-S => discussed taking these regularly + trial AMITIZA871md (samples) as the Linzess was "too strong" in both doses; she is hoping that the planned hysterectomy will help her chr constipation...  PMDD, Ovarian cysts, Uterine fibroid>  All followed by [name]/ [facility]GYN w/ hysterectomy planned soon...  Hx LBP>  She uses Lodine as needed; she notes that her LBP radiates to legs & esp around her menses...  Anxiety/ Psyche>  she uses the Alpraz as needed; [facility] JoHughes on  Vyvance (for new dx of Adult ADD) and prn alpraz... Other medical issues as noted...   Patient's Medications  New Prescriptions   PANTOPRAZOLE (PROTONIX) 40 MG TABLET    Take 1 tablet (40 mg total) by mouth daily.  Previous Medications   ACYCLOVIR (ZOVIRAX) 400 MG TABLET    TAKE 1 TABLET BY MOUTH 4 TIMES A DAY AS NEEDED   ACYCLOVIR (ZOVIRAX) 5 % OINTMENT    Apply 1 application topically every 3 (three) hours. As directed    LISDEXAMFETAMINE (VYVANSE) 40 MG CAPSULE    Take 40 mg by mouth every morning.   PSYLLIUM (METAMUCIL) 58.6 % POWDER    Take 1 packet by mouth daily as needed.   SENNA (SENOKOT) 8.6 MG TABLET    Take 2 tablets by mouth at bedtime.    Modified Medications   Modified Medication Previous Medication   ALPRAZOLAM (XANAX) 0.5 MG TABLET ALPRAZolam (XANAX) 0.5 MG tablet      TAKE 1/2 TO 1 TABLET BY MOUTH 3 TIMES A DAY AS NEEDED FOR NERVES    TAKE 1/2 TO 1 TABLET BY MOUTH 3 TIMES A DAY  AS NEEDED FOR NERVES   ETODOLAC (LODINE) 400 MG TABLET etodolac (LODINE) 400 MG tablet      Take 1 tablet (400 mg total) by mouth 2 (two) times daily. As needed for arthritis pain    Take 1 tablet (400 mg total) by mouth 2 (two) times daily. As needed for arthritis pain  Discontinued Medications   LINACLOTIDE (LINZESS) 145 MCG CAPS CAPSULE    Take 145 mcg by mouth daily.   POLYETHYLENE GLYCOL (MIRALAX / GLYCOLAX) PACKET    Take 17 g by mouth 2 (two) times daily.

## 2015-01-03 ENCOUNTER — Other Ambulatory Visit (INDEPENDENT_AMBULATORY_CARE_PROVIDER_SITE_OTHER): Payer: BLUE CROSS/BLUE SHIELD

## 2015-01-03 DIAGNOSIS — Z0000 Encounter for general adult medical examination without abnormal findings: Secondary | ICD-10-CM

## 2015-01-03 LAB — CBC WITH DIFFERENTIAL/PLATELET
BASOS PCT: 0.6 % (ref 0.0–3.0)
Basophils Absolute: 0 10*3/uL (ref 0.0–0.1)
EOS PCT: 1.2 % (ref 0.0–5.0)
Eosinophils Absolute: 0.1 10*3/uL (ref 0.0–0.7)
HCT: 42.1 % (ref 36.0–46.0)
Hemoglobin: 14 g/dL (ref 12.0–15.0)
Lymphocytes Relative: 30.1 % (ref 12.0–46.0)
Lymphs Abs: 1.7 10*3/uL (ref 0.7–4.0)
MCHC: 33.2 g/dL (ref 30.0–36.0)
MCV: 91.4 fl (ref 78.0–100.0)
Monocytes Absolute: 0.4 10*3/uL (ref 0.1–1.0)
Monocytes Relative: 7.1 % (ref 3.0–12.0)
Neutro Abs: 3.5 10*3/uL (ref 1.4–7.7)
Neutrophils Relative %: 61 % (ref 43.0–77.0)
Platelets: 305 10*3/uL (ref 150.0–400.0)
RBC: 4.61 Mil/uL (ref 3.87–5.11)
RDW: 13 % (ref 11.5–15.5)
WBC: 5.8 10*3/uL (ref 4.0–10.5)

## 2015-01-03 LAB — HEPATIC FUNCTION PANEL
ALBUMIN: 3.7 g/dL (ref 3.5–5.2)
ALK PHOS: 41 U/L (ref 39–117)
ALT: 11 U/L (ref 0–35)
AST: 17 U/L (ref 0–37)
Bilirubin, Direct: 0.2 mg/dL (ref 0.0–0.3)
Total Bilirubin: 0.7 mg/dL (ref 0.2–1.2)
Total Protein: 6.9 g/dL (ref 6.0–8.3)

## 2015-01-03 LAB — BASIC METABOLIC PANEL
BUN: 11 mg/dL (ref 6–23)
CO2: 27 meq/L (ref 19–32)
Calcium: 9.2 mg/dL (ref 8.4–10.5)
Chloride: 104 mEq/L (ref 96–112)
Creatinine, Ser: 0.7 mg/dL (ref 0.4–1.2)
GFR: 98.4 mL/min (ref >60.00)
Glucose, Bld: 77 mg/dL (ref 70–99)
Potassium: 4 mEq/L (ref 3.5–5.1)
Sodium: 139 mEq/L (ref 135–145)

## 2015-01-03 LAB — LIPID PANEL
CHOLESTEROL: 143 mg/dL (ref 0–200)
HDL: 43.8 mg/dL (ref >39.00)
LDL Cholesterol: 86 mg/dL (ref 0–99)
NonHDL: 99.2
TRIGLYCERIDES: 64 mg/dL (ref 0.0–149.0)
Total CHOL/HDL Ratio: 3
VLDL: 12.8 mg/dL (ref 0.0–40.0)

## 2015-01-03 LAB — TSH: TSH: 3.85 u[IU]/mL (ref 0.35–4.50)

## 2015-02-16 ENCOUNTER — Encounter (HOSPITAL_COMMUNITY)
Admission: RE | Admit: 2015-02-16 | Discharge: 2015-02-16 | Disposition: A | Discharge status: Home / Self Care | Payer: BLUE CROSS/BLUE SHIELD | Source: Ambulatory Visit | Attending: Obstetrics and Gynecology | Admitting: Obstetrics and Gynecology

## 2015-02-16 ENCOUNTER — Encounter (HOSPITAL_COMMUNITY): Payer: Self-pay

## 2015-02-16 DIAGNOSIS — Z01818 Encounter for other preprocedural examination: Secondary | ICD-10-CM | POA: Diagnosis present

## 2015-02-16 DIAGNOSIS — N939 Abnormal uterine and vaginal bleeding, unspecified: Secondary | ICD-10-CM | POA: Insufficient documentation

## 2015-02-16 HISTORY — DX: Herpesviral infection, unspecified: B00.9

## 2015-02-16 HISTORY — DX: Gastro-esophageal reflux disease without esophagitis: K21.9

## 2015-02-16 HISTORY — DX: Unspecified osteoarthritis, unspecified site: M19.90

## 2015-02-16 LAB — CBC
HCT: 39.5 % (ref 36.0–46.0)
HEMOGLOBIN: 13.6 g/dL (ref 12.0–15.0)
MCH: 30.7 pg (ref 26.0–34.0)
MCHC: 34.4 g/dL (ref 30.0–36.0)
MCV: 89.2 fL (ref 78.0–100.0)
Platelets: 280 10*3/uL (ref 150–400)
RBC: 4.43 MIL/uL (ref 3.87–5.11)
RDW: 12.3 % (ref 11.5–15.5)
WBC: 7.8 10*3/uL (ref 4.0–10.5)

## 2015-02-16 NOTE — Patient Instructions (Signed)
   Your procedure is scheduled on:  Monday, March 7  Enter through the Main Entrance of [facility] at: 6 AM Pick up the phone at the desk and dial [phone] and inform us of your arrival.  Please call this number if you have any problems the morning of surgery: [phone]  Remember: Do not eat or drink after midnight: Sunday Take these medicines the morning of surgery with a SIP OF WATER:  Do not wear jewelry, make-up, or FINGER nail polish No metal in your hair or on your body. Do not wear lotions, powders, perfumes.  You may wear deodorant.  Do not bring valuables to the hospital. Contacts, dentures or bridgework may not be worn into surgery.  Leave suitcase in the car. After Surgery it may be brought to your room. For patients being admitted to the hospital, checkout time is 11:00am the day of discharge.

## 2015-02-16 NOTE — Patient Instructions (Addendum)
   Your procedure is scheduled on: Monday, March 7  [facility] through the [facility] at: 6 AM Pick up the phone at the desk and dial [phone] and inform us of your arrival.  Please call this number if you have any problems the morning of surgery: [phone]  Remember: Do not eat or drink after midnight:  Sunday Take these medicines the morning of surgery with a SIP OF WATER:  Protonix, valtrex and xanax if needed.    Do not wear jewelry, make-up, or FINGER nail polish No metal in your hair or on your body. Do not wear lotions, powders, perfumes.  You may wear deodorant.  Do not bring valuables to the hospital. Contacts, dentures or bridgework may not be worn into surgery.  Leave suitcase in the car. After Surgery it may be brought to your room. For patients being admitted to the hospital, checkout time is 11:00am the day of discharge.  Home with husband Duane cell [phone]

## 2015-02-17 ENCOUNTER — Inpatient Hospital Stay (HOSPITAL_COMMUNITY)
Admission: RE | Admit: 2015-02-17 | Discharge: 2015-02-17 | Disposition: A | Discharge status: Home / Self Care | Payer: BLUE CROSS/BLUE SHIELD | Source: Ambulatory Visit

## 2015-02-27 NOTE — H&P (Signed)
Mia Hunter is an 39 y.o. female G2P2 who presents for a scheduled LAVH/Bilateral salpingectomy for ongoing issues with abnormal bleeding and pelvic pain/dysmenorrhea. She reports menses every 19-21 days but spots also about 5 days prior. She has heavy flow x 2 days and she will begin to have bloating and pain for week prior to cycle. She has pelvic pain 3/4 weeks each month. Ultrasound showed small fibroids and possible adenomyosis.  EMB was benign.   She has had a prior tubal ligation and does not plan any future childbearing. Emotional labile as well, maybe about 1 good week. Menstrual week she is depressed and irritable. She has tried many types of OCP's and they just make things worse--she is ready to feel better and wants definitive surgery.    Pertinent Gynecological History: OB History:   NSVD x 2   Menstrual History:  No LMP recorded.    Past Medical History  Diagnosis Date  . Cigarette smoker   . IBS (irritable bowel syndrome)     diet controlled  . Constipation   . Premenstrual syndrome   . Lumbar back pain   . Anxiety   . Depression   . 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 SVD (spontaneous vaginal delivery)     x 2  . HSV infection   . GERD (gastroesophageal reflux disease)   . Arthritis     lower back  . ADHD (attention deficit hyperactivity disorder)     Past Surgical History  Procedure Laterality Date  . Hernia repair  10/2010    done by [facility]  . Wisdom tooth extraction    . Tubal ligation      No family history on file.  Social History:  reports that she quit smoking about 4 years ago. Her smoking use included Cigarettes. She has a 7.5 pack-year smoking history. She has never used smokeless tobacco. She reports that she drinks alcohol. She reports that she does not use illicit drugs.  Allergies: No Known Allergies  No prescriptions prior to admission    ROS  There were no vitals taken for this visit. Physical Exam  Constitutional: She  is oriented to person, place, and time. She appears well-developed and well-nourished.  Cardiovascular: Normal rate and regular rhythm.   Respiratory: Effort normal and breath sounds normal.  GI: Soft. Bowel sounds are normal.  Genitourinary: Vagina normal.  Uterus 8 weeks size Adnexa WNL  Neurological: She is alert and oriented to person, place, and time.  Psychiatric: She has a normal mood and affect.    No results found for this or any previous visit (from the past 24 hour(s)).  No results found.  Assessment/Plan: The patient was counseled regarding the risks of laparoscopic assisted vaginal hysterectomy, The procedure was reviewed in detail and expectations regarding recovery. Risks of bleeding, infection and possible damage to bowel and bladder were reviewed. The patient understands that should a complication arise she would likely need a larger abdominal incision and that this would delay her recovery. She would accept a blood transfusion if needed. We also discussed removal of the fallopian tubes as a means of possibly reducing future risk of ovarian cancer and she is agreeable to this. She will retain her ovaries unless there is obvious pathology the would warrant removal. She is ready to proceed.   Oliver PilaICHARDSON,Mia Hunter 02/27/2015, 7:03 PM

## 2015-02-28 ENCOUNTER — Ambulatory Visit (HOSPITAL_COMMUNITY): Payer: BLUE CROSS/BLUE SHIELD | Admitting: Anesthesiology

## 2015-02-28 ENCOUNTER — Encounter (HOSPITAL_COMMUNITY)
Admission: RE | Disposition: A | Discharge status: Home / Self Care | Payer: Self-pay | Source: Ambulatory Visit | Attending: Obstetrics and Gynecology

## 2015-02-28 ENCOUNTER — Ambulatory Visit (HOSPITAL_COMMUNITY)
Admission: RE | Admit: 2015-02-28 | Discharge: 2015-03-01 | Disposition: A | Discharge status: Home / Self Care | Payer: BLUE CROSS/BLUE SHIELD | Source: Ambulatory Visit | Attending: Obstetrics and Gynecology | Admitting: Obstetrics and Gynecology

## 2015-02-28 ENCOUNTER — Encounter (HOSPITAL_COMMUNITY): Payer: Self-pay | Admitting: *Deleted

## 2015-02-28 DIAGNOSIS — F909 Attention-deficit hyperactivity disorder, unspecified type: Secondary | ICD-10-CM | POA: Diagnosis not present

## 2015-02-28 DIAGNOSIS — F329 Major depressive disorder, single episode, unspecified: Secondary | ICD-10-CM | POA: Insufficient documentation

## 2015-02-28 DIAGNOSIS — M4696 Unspecified inflammatory spondylopathy, lumbar region: Secondary | ICD-10-CM | POA: Diagnosis not present

## 2015-02-28 DIAGNOSIS — F419 Anxiety disorder, unspecified: Secondary | ICD-10-CM | POA: Insufficient documentation

## 2015-02-28 DIAGNOSIS — K5900 Constipation, unspecified: Secondary | ICD-10-CM | POA: Insufficient documentation

## 2015-02-28 DIAGNOSIS — K219 Gastro-esophageal reflux disease without esophagitis: Secondary | ICD-10-CM | POA: Diagnosis not present

## 2015-02-28 DIAGNOSIS — Z87891 Personal history of nicotine dependence: Secondary | ICD-10-CM | POA: Insufficient documentation

## 2015-02-28 DIAGNOSIS — K589 Irritable bowel syndrome without diarrhea: Secondary | ICD-10-CM | POA: Diagnosis not present

## 2015-02-28 DIAGNOSIS — N946 Dysmenorrhea, unspecified: Secondary | ICD-10-CM | POA: Diagnosis not present

## 2015-02-28 DIAGNOSIS — N938 Other specified abnormal uterine and vaginal bleeding: Secondary | ICD-10-CM | POA: Diagnosis present

## 2015-02-28 DIAGNOSIS — R102 Pelvic and perineal pain: Secondary | ICD-10-CM | POA: Insufficient documentation

## 2015-02-28 HISTORY — PX: LAPAROSCOPIC ASSISTED VAGINAL HYSTERECTOMY: SHX5398

## 2015-02-28 HISTORY — PX: BILATERAL SALPINGECTOMY: SHX5743

## 2015-02-28 LAB — BASIC METABOLIC PANEL
ANION GAP: 7 (ref 5–15)
Anion gap: 6 (ref 5–15)
BUN: 14 mg/dL (ref 6–23)
BUN: 10 mg/dL (ref 6–23)
CALCIUM: 8.8 mg/dL (ref 8.4–10.5)
CHLORIDE: 105 mmol/L (ref 96–112)
CO2: 28 mmol/L (ref 19–32)
CO2: 28 mmol/L (ref 19–32)
Calcium: 9.1 mg/dL (ref 8.4–10.5)
Chloride: 103 mmol/L (ref 96–112)
Creatinine, Ser: 0.76 mg/dL (ref 0.50–1.10)
Creatinine, Ser: 0.68 mg/dL (ref 0.50–1.10)
GFR calc Af Amer: >90 mL/min (ref >90)
GFR calc Af Amer: >90 mL/min (ref >90)
GFR calc non Af Amer: >90 mL/min (ref >90)
GLUCOSE: 85 mg/dL (ref 70–99)
GLUCOSE: 123 mg/dL — AB (ref 70–99)
POTASSIUM: 3.6 mmol/L (ref 3.5–5.1)
POTASSIUM: 4.2 mmol/L (ref 3.5–5.1)
SODIUM: 139 mmol/L (ref 135–145)
SODIUM: 138 mmol/L (ref 135–145)

## 2015-02-28 LAB — CBC
HCT: 40.8 % (ref 36.0–46.0)
HCT: 38.1 % (ref 36.0–46.0)
HEMOGLOBIN: 13.1 g/dL (ref 12.0–15.0)
Hemoglobin: 13.9 g/dL (ref 12.0–15.0)
MCH: 30.3 pg (ref 26.0–34.0)
MCH: 30.5 pg (ref 26.0–34.0)
MCHC: 34.1 g/dL (ref 30.0–36.0)
MCHC: 34.4 g/dL (ref 30.0–36.0)
MCV: 89.1 fL (ref 78.0–100.0)
MCV: 88.8 fL (ref 78.0–100.0)
PLATELETS: 273 10*3/uL (ref 150–400)
Platelets: 225 10*3/uL (ref 150–400)
RBC: 4.58 MIL/uL (ref 3.87–5.11)
RBC: 4.29 MIL/uL (ref 3.87–5.11)
RDW: 12.4 % (ref 11.5–15.5)
RDW: 12.2 % (ref 11.5–15.5)
WBC: 7 10*3/uL (ref 4.0–10.5)
WBC: 12.9 10*3/uL — AB (ref 4.0–10.5)

## 2015-02-28 SURGERY — HYSTERECTOMY, VAGINAL, LAPAROSCOPY-ASSISTED
Anesthesia: General | Site: Abdomen

## 2015-02-28 MED ORDER — METHYLPHENIDATE HCL ER (LA) 10 MG PO CP24: 10.0000 mg | ORAL_CAPSULE | Freq: Every day | ORAL | Status: DC

## 2015-02-28 MED ORDER — SCOPOLAMINE 1 MG/3DAYS TD PT72
1.0000 | MEDICATED_PATCH | Freq: Once | TRANSDERMAL | Status: DC
  Administered 2015-02-28: 1.5 mg via TRANSDERMAL

## 2015-02-28 MED ORDER — LACTATED RINGERS IV SOLN
INTRAVENOUS | Status: DC
  Administered 2015-02-28 (×3): via INTRAVENOUS

## 2015-02-28 MED ORDER — ROCURONIUM BROMIDE 100 MG/10ML IV SOLN
INTRAVENOUS | Status: AC
  Filled 2015-02-28: qty 1

## 2015-02-28 MED ORDER — PANTOPRAZOLE SODIUM 40 MG PO TBEC
40.0000 mg | DELAYED_RELEASE_TABLET | Freq: Every day | ORAL | Status: DC
  Administered 2015-03-01: 40 mg via ORAL
  Filled 2015-02-28: qty 1

## 2015-02-28 MED ORDER — ACETAMINOPHEN 325 MG PO TABS: 325.0000 mg | ORAL_TABLET | ORAL | Status: DC | PRN

## 2015-02-28 MED ORDER — LIDOCAINE HCL (CARDIAC) 20 MG/ML IV SOLN
INTRAVENOUS | Status: DC | PRN
  Administered 2015-02-28: 80 mg via INTRAVENOUS

## 2015-02-28 MED ORDER — DEXAMETHASONE SODIUM PHOSPHATE 4 MG/ML IJ SOLN
INTRAMUSCULAR | Status: AC
  Filled 2015-02-28: qty 1

## 2015-02-28 MED ORDER — VASOPRESSIN 20 UNIT/ML IV SOLN
INTRAVENOUS | Status: AC
  Filled 2015-02-28: qty 1

## 2015-02-28 MED ORDER — NEOSTIGMINE METHYLSULFATE 10 MG/10ML IV SOLN
INTRAVENOUS | Status: AC
  Filled 2015-02-28: qty 1

## 2015-02-28 MED ORDER — MIDAZOLAM HCL 2 MG/2ML IJ SOLN: 0.5000 mg | Freq: Once | INTRAMUSCULAR | Status: DC | PRN

## 2015-02-28 MED ORDER — MEPERIDINE HCL 25 MG/ML IJ SOLN: 6.2500 mg | INTRAMUSCULAR | Status: DC | PRN

## 2015-02-28 MED ORDER — SODIUM CHLORIDE 0.9 % IJ SOLN
INTRAMUSCULAR | Status: AC
  Filled 2015-02-28: qty 100

## 2015-02-28 MED ORDER — VASOPRESSIN 20 UNIT/ML IV SOLN
INTRAVENOUS | Status: DC | PRN
  Administered 2015-02-28: 9 mL via INTRAMUSCULAR

## 2015-02-28 MED ORDER — NEOSTIGMINE METHYLSULFATE 10 MG/10ML IV SOLN
INTRAVENOUS | Status: DC | PRN
  Administered 2015-02-28: 3 mg via INTRAVENOUS

## 2015-02-28 MED ORDER — DIPHENHYDRAMINE HCL 12.5 MG/5ML PO ELIX: 12.5000 mg | ORAL_SOLUTION | Freq: Four times a day (QID) | ORAL | Status: DC | PRN

## 2015-02-28 MED ORDER — BUPIVACAINE HCL (PF) 0.25 % IJ SOLN
INTRAMUSCULAR | Status: AC
  Filled 2015-02-28: qty 10

## 2015-02-28 MED ORDER — FENTANYL CITRATE 0.05 MG/ML IJ SOLN
INTRAMUSCULAR | Status: DC | PRN
  Administered 2015-02-28 (×3): 50 ug via INTRAVENOUS
  Administered 2015-02-28: 100 ug via INTRAVENOUS

## 2015-02-28 MED ORDER — ONDANSETRON HCL 4 MG/2ML IJ SOLN
INTRAMUSCULAR | Status: AC
  Filled 2015-02-28: qty 2

## 2015-02-28 MED ORDER — GLYCOPYRROLATE 0.2 MG/ML IJ SOLN
INTRAMUSCULAR | Status: AC
  Filled 2015-02-28: qty 3

## 2015-02-28 MED ORDER — LACTATED RINGERS IV SOLN: INTRAVENOUS | Status: DC

## 2015-02-28 MED ORDER — SODIUM CHLORIDE 0.9 % IJ SOLN
INTRAMUSCULAR | Status: DC | PRN
  Administered 2015-02-28: 10 mL

## 2015-02-28 MED ORDER — ACETAMINOPHEN 160 MG/5ML PO SOLN: 325.0000 mg | ORAL | Status: DC | PRN

## 2015-02-28 MED ORDER — NALOXONE HCL 0.4 MG/ML IJ SOLN: 0.4000 mg | INTRAMUSCULAR | Status: DC | PRN

## 2015-02-28 MED ORDER — DEXAMETHASONE SODIUM PHOSPHATE 10 MG/ML IJ SOLN
INTRAMUSCULAR | Status: DC | PRN
  Administered 2015-02-28: 4 mg via INTRAVENOUS

## 2015-02-28 MED ORDER — LISDEXAMFETAMINE DIMESYLATE 50 MG PO CAPS: 50.0000 mg | ORAL_CAPSULE | Freq: Every day | ORAL | Status: DC

## 2015-02-28 MED ORDER — MIDAZOLAM HCL 2 MG/2ML IJ SOLN
INTRAMUSCULAR | Status: AC
  Filled 2015-02-28: qty 2

## 2015-02-28 MED ORDER — GLYCOPYRROLATE 0.2 MG/ML IJ SOLN
INTRAMUSCULAR | Status: DC | PRN
  Administered 2015-02-28 (×2): 0.1 mg via INTRAVENOUS
  Administered 2015-02-28: 0.6 mg via INTRAVENOUS

## 2015-02-28 MED ORDER — ONDANSETRON HCL 4 MG/2ML IJ SOLN
INTRAMUSCULAR | Status: DC | PRN
  Administered 2015-02-28: 4 mg via INTRAVENOUS

## 2015-02-28 MED ORDER — HYDROMORPHONE HCL 1 MG/ML IJ SOLN
0.2500 mg | INTRAMUSCULAR | Status: DC | PRN
  Administered 2015-02-28 (×2): 0.5 mg via INTRAVENOUS

## 2015-02-28 MED ORDER — DIPHENHYDRAMINE HCL 50 MG/ML IJ SOLN: 12.5000 mg | Freq: Four times a day (QID) | INTRAMUSCULAR | Status: DC | PRN

## 2015-02-28 MED ORDER — HYDROMORPHONE BOLUS VIA INFUSION
0.7000 mg | Freq: Once | INTRAVENOUS | Status: AC
  Filled 2015-02-28: qty 1

## 2015-02-28 MED ORDER — HEPARIN SODIUM (PORCINE) 5000 UNIT/ML IJ SOLN
INTRAMUSCULAR | Status: AC
  Filled 2015-02-28: qty 1

## 2015-02-28 MED ORDER — PROPOFOL 10 MG/ML IV BOLUS
INTRAVENOUS | Status: AC
  Filled 2015-02-28: qty 20

## 2015-02-28 MED ORDER — BUPIVACAINE HCL (PF) 0.25 % IJ SOLN
INTRAMUSCULAR | Status: DC | PRN
  Administered 2015-02-28: 10 mL

## 2015-02-28 MED ORDER — PROPOFOL 10 MG/ML IV BOLUS
INTRAVENOUS | Status: DC | PRN
  Administered 2015-02-28: 180 mg via INTRAVENOUS

## 2015-02-28 MED ORDER — ROCURONIUM BROMIDE 100 MG/10ML IV SOLN
INTRAVENOUS | Status: DC | PRN
  Administered 2015-02-28: 40 mg via INTRAVENOUS

## 2015-02-28 MED ORDER — FENTANYL CITRATE 0.05 MG/ML IJ SOLN
INTRAMUSCULAR | Status: AC
  Filled 2015-02-28: qty 5

## 2015-02-28 MED ORDER — ESTRADIOL 0.1 MG/GM VA CREA
TOPICAL_CREAM | VAGINAL | Status: AC
  Filled 2015-02-28: qty 42.5

## 2015-02-28 MED ORDER — SODIUM CHLORIDE 0.9 % IJ SOLN
INTRAMUSCULAR | Status: AC
  Filled 2015-02-28: qty 10

## 2015-02-28 MED ORDER — VALACYCLOVIR HCL 500 MG PO TABS
500.0000 mg | ORAL_TABLET | Freq: Every day | ORAL | Status: DC
  Administered 2015-03-01: 500 mg via ORAL
  Filled 2015-02-28 (×2): qty 1

## 2015-02-28 MED ORDER — HYDROMORPHONE HCL 1 MG/ML IJ SOLN
INTRAMUSCULAR | Status: AC
  Filled 2015-02-28: qty 1

## 2015-02-28 MED ORDER — SODIUM CHLORIDE 0.9 % IJ SOLN: 9.0000 mL | INTRAMUSCULAR | Status: DC | PRN

## 2015-02-28 MED ORDER — ONDANSETRON HCL 4 MG/2ML IJ SOLN: 4.0000 mg | Freq: Four times a day (QID) | INTRAMUSCULAR | Status: DC | PRN

## 2015-02-28 MED ORDER — HYDROMORPHONE 0.3 MG/ML IV SOLN
INTRAVENOUS | Status: DC
  Administered 2015-02-28: 3.13 mg via INTRAVENOUS
  Administered 2015-02-28: 12.33 mL via INTRAVENOUS
  Administered 2015-02-28: 11:00:00 via INTRAVENOUS
  Administered 2015-02-28: 0.7 mg via INTRAVENOUS
  Administered 2015-03-01: 0.599 mg via INTRAVENOUS
  Administered 2015-03-01: 0.6 mg via INTRAVENOUS
  Filled 2015-02-28: qty 25

## 2015-02-28 MED ORDER — MIDAZOLAM HCL 2 MG/2ML IJ SOLN
INTRAMUSCULAR | Status: DC | PRN
  Administered 2015-02-28: 2 mg via INTRAVENOUS

## 2015-02-28 MED ORDER — CEFAZOLIN SODIUM-DEXTROSE 2-3 GM-% IV SOLR
2.0000 g | INTRAVENOUS | Status: AC
  Administered 2015-02-28: 2 g via INTRAVENOUS

## 2015-02-28 MED ORDER — KETOROLAC TROMETHAMINE 30 MG/ML IJ SOLN: 30.0000 mg | Freq: Once | INTRAMUSCULAR | Status: DC | PRN

## 2015-02-28 MED ORDER — PROMETHAZINE HCL 25 MG/ML IJ SOLN: 6.2500 mg | INTRAMUSCULAR | Status: DC | PRN

## 2015-02-28 MED ORDER — LIDOCAINE HCL (CARDIAC) 20 MG/ML IV SOLN
INTRAVENOUS | Status: AC
  Filled 2015-02-28: qty 5

## 2015-02-28 MED ORDER — CEFAZOLIN SODIUM-DEXTROSE 2-3 GM-% IV SOLR
INTRAVENOUS | Status: AC
  Filled 2015-02-28: qty 50

## 2015-02-28 MED ORDER — KETOROLAC TROMETHAMINE 30 MG/ML IJ SOLN
INTRAMUSCULAR | Status: AC
  Filled 2015-02-28: qty 1

## 2015-02-28 MED ORDER — LACTATED RINGERS IV SOLN
INTRAVENOUS | Status: DC
  Administered 2015-02-28 – 2015-03-01 (×4): via INTRAVENOUS

## 2015-02-28 MED ORDER — SCOPOLAMINE 1 MG/3DAYS TD PT72
MEDICATED_PATCH | TRANSDERMAL | Status: AC
  Administered 2015-02-28: 1.5 mg via TRANSDERMAL
  Filled 2015-02-28: qty 1

## 2015-02-28 MED ORDER — KETOROLAC TROMETHAMINE 30 MG/ML IJ SOLN
INTRAMUSCULAR | Status: DC | PRN
  Administered 2015-02-28: 30 mg via INTRAVENOUS

## 2015-02-28 SURGICAL SUPPLY — 45 items
CABLE HIGH FREQUENCY MONO STRZ (ELECTRODE) IMPLANT
CATH ROBINSON RED A/P 16FR (CATHETERS) ×4 IMPLANT
CLOSURE WOUND 1/4 X3 (GAUZE/BANDAGES/DRESSINGS)
CLOTH BEACON ORANGE TIMEOUT ST (SAFETY) ×4 IMPLANT
CONT PATH 16OZ SNAP LID 3702 (MISCELLANEOUS) ×4 IMPLANT
COVER BACK TABLE 60X90IN (DRAPES) ×4 IMPLANT
COVER MAYO STAND STRL (DRAPES) IMPLANT
DECANTER SPIKE VIAL GLASS SM (MISCELLANEOUS) ×4 IMPLANT
DRSG COVADERM PLUS 2X2 (GAUZE/BANDAGES/DRESSINGS) ×4 IMPLANT
DRSG OPSITE POSTOP 3X4 (GAUZE/BANDAGES/DRESSINGS) IMPLANT
DRSG OPSITE POSTOP 4X10 (GAUZE/BANDAGES/DRESSINGS) ×4 IMPLANT
DURAPREP 26ML APPLICATOR (WOUND CARE) ×4 IMPLANT
ELECT REM PT RETURN 9FT ADLT (ELECTROSURGICAL) ×4
ELECTRODE REM PT RTRN 9FT ADLT (ELECTROSURGICAL) ×2 IMPLANT
FILTER SMOKE EVAC LAPAROSHD (FILTER) IMPLANT
GLOVE BIO SURGEON STRL SZ 6.5 (GLOVE) ×9 IMPLANT
GLOVE BIO SURGEONS STRL SZ 6.5 (GLOVE) ×3
GLOVE BIOGEL PI IND STRL 6.5 (GLOVE) ×2 IMPLANT
GLOVE BIOGEL PI IND STRL 7.0 (GLOVE) ×4 IMPLANT
GLOVE BIOGEL PI INDICATOR 6.5 (GLOVE) ×2
GLOVE BIOGEL PI INDICATOR 7.0 (GLOVE) ×4
NEEDLE INSUFFLATION 120MM (ENDOMECHANICALS) ×4 IMPLANT
NS IRRIG 1000ML POUR BTL (IV SOLUTION) ×4 IMPLANT
PACK LAVH (CUSTOM PROCEDURE TRAY) ×4 IMPLANT
PACK ROBOTIC GOWN (GOWN DISPOSABLE) ×4 IMPLANT
PAD POSITIONER PINK NONSTERILE (MISCELLANEOUS) ×4 IMPLANT
PROTECTOR NERVE ULNAR (MISCELLANEOUS) ×4 IMPLANT
SET IRRIG TUBING LAPAROSCOPIC (IRRIGATION / IRRIGATOR) IMPLANT
SHEARS HARMONIC ACE PLUS 36CM (ENDOMECHANICALS) ×4 IMPLANT
SLEEVE XCEL OPT CAN 5 100 (ENDOMECHANICALS) ×8 IMPLANT
STRIP CLOSURE SKIN 1/4X3 (GAUZE/BANDAGES/DRESSINGS) IMPLANT
SUT SILK 0 FSL (SUTURE) ×4 IMPLANT
SUT VIC AB 0 CT1 18XCR BRD8 (SUTURE) ×4 IMPLANT
SUT VIC AB 0 CT1 8-18 (SUTURE) ×4
SUT VIC AB 2-0 CT1 (SUTURE) ×8 IMPLANT
SUT VIC AB 3-0 SH 27 (SUTURE) ×2
SUT VIC AB 3-0 SH 27X BRD (SUTURE) ×2 IMPLANT
SUT VICRYL 0 TIES 12 18 (SUTURE) IMPLANT
SUT VICRYL 0 UR6 27IN ABS (SUTURE) IMPLANT
SUT VICRYL 4-0 PS2 18IN ABS (SUTURE) ×4 IMPLANT
TOWEL OR 17X24 6PK STRL BLUE (TOWEL DISPOSABLE) ×8 IMPLANT
TRAY FOLEY CATH 14FR (SET/KITS/TRAYS/PACK) ×4 IMPLANT
TROCAR XCEL NON-BLD 5MMX100MML (ENDOMECHANICALS) ×4 IMPLANT
WARMER LAPAROSCOPE (MISCELLANEOUS) ×4 IMPLANT
WATER STERILE IRR 1000ML POUR (IV SOLUTION) ×4 IMPLANT

## 2015-02-28 NOTE — Progress Notes (Signed)
Patient ID: Mia Hunter, female   DOB: March 30, 1976, 39 y.o.   MRN: [id number]  POD#0 - s/p LAVH, B salpingectomy  Pain better controlled with extra dose, got up, did well.  tol po.  sleepy  AFVSS gen NAD CV RRR Lungs CTAB Abd soft, app tender Inc C/D/I Ext sym, NT  Continue current mgmt Doing well.

## 2015-02-28 NOTE — Anesthesia Preprocedure Evaluation (Signed)
Anesthesia Evaluation  Patient identified by MRN, date of birth, ID band Patient awake    Reviewed: Allergy & Precautions, H&P , Patient's Chart, lab work & pertinent test results, reviewed documented beta blocker date and time   History of Anesthesia Complications Negative for: history of anesthetic complications  Airway Mallampati: II  TM Distance: >3 FB Neck ROM: full    Dental   Pulmonary former smoker,  breath sounds clear to auscultation        Cardiovascular Exercise Tolerance: Good Rhythm:regular Rate:Normal     Neuro/Psych PSYCHIATRIC DISORDERS    GI/Hepatic GERD-  Controlled,  Endo/Other    Renal/GU      Musculoskeletal   Abdominal   Peds  Hematology   Anesthesia Other Findings   Reproductive/Obstetrics                             Anesthesia Physical Anesthesia Plan  ASA: II  Anesthesia Plan: General ETT   Post-op Pain Management:    Induction:   Airway Management Planned:   Additional Equipment:   Intra-op Plan:   Post-operative Plan:   Informed Consent: I have reviewed the patients History and Physical, chart, labs and discussed the procedure including the risks, benefits and alternatives for the proposed anesthesia with the patient or authorized representative who has indicated his/her understanding and acceptance.   Dental Advisory Given  Plan Discussed with: CRNA and Surgeon  Anesthesia Plan Comments:         Anesthesia Quick Evaluation

## 2015-02-28 NOTE — Progress Notes (Signed)
Patient ID: Mia Hunter, female   DOB: 04/10/1976, 39 y.o.   MRN: [id number] Per pt no changes in dictated H&P.  Desires to proceed. Brief exam WNL.

## 2015-02-28 NOTE — Transfer of Care (Signed)
Immediate Anesthesia Transfer of Care Note  Patient: Mia Hunter  Procedure(s) Performed: Procedure(s): LAPAROSCOPIC ASSISTED VAGINAL HYSTERECTOMY (N/A) BILATERAL SALPINGECTOMY (Bilateral)  Patient Location: PACU  Anesthesia Type:General  Level of Consciousness: awake, alert  and oriented  Airway & Oxygen Therapy: Patient Spontanous Breathing and Patient connected to nasal cannula oxygen  Post-op Assessment: Report given to RN, Post -op Vital signs reviewed and stable and Patient moving all extremities  Post vital signs: Reviewed and stable  Last Vitals:  Filed Vitals:   02/28/15 0631  BP: 125/78  Pulse: 75  Temp: 36.6 C  Resp: 18    Complications: No apparent anesthesia complications

## 2015-02-28 NOTE — Op Note (Signed)
Operative note  Preoperative diagnosis Abnormal uterine bleeding Pelvic pain Dysmenorrhea  Postoperative diagnosis Same  Procedure Laparoscopic-assisted vaginal hysterectomy with bilateral salpingectomy  Surgeon Dr. Huel Cote Dr. Tawanna Cooler Meisinger  Anesthesia Gen.  Fluids Estimated blood loss 200 cc Urine output 125 cc clear urine IV fluid 2200 cc LR  Findings Uterus was slightly enlarged approximately 8 weeks in size and the ovaries appeared normal bilaterally. Fallopian tubes had a previous interruption from a tubal ligation otherwise appeared normal. The liver edge was normal there were some right upper quadrant adhesions of mesentery. The appendix appeared normal. There is a pre-existing permanent suture in the upper right inguinal area.  Specimen Uterus tubes and cervix  Procedure note Patient was taken to the operating room where general anesthesia was obtained without difficulty. She was prepped and draped in the normal sterile fashion in the dorsal lithotomy position. An appropriate time out was performed. With a Foley catheter in place, attention was turned to the abdomen where the infraumbilical area was injected with quarter percent Marcaine. A small incision was made with scalpel and varies needle introduced into the peritoneal cavity. Intraperitoneal placement was confirmed with aspiration injection with normal saline. Gas flow was applied and pneumoperitoneum obtained with approximately 2 and half liters of CO2 gas. The camera was then introduced into the port and the pelvis and abdomen inspected with findings as previously stated. There was an apparent old permanent suture in the per inguinal area on the right. 2 additional ports were then placed in the upper lateral quadrants both 5 mm in size under direct visualization. The right fallopian tube was then grasped atraumatically and reflected medially. The harmonic scalpel was then utilized to free the tube from the  mesosalpinx down to the level of the cornua. The cornual was then grasped with a 5 mm tenaculum and the remainder of the utero-ovarian ligament broad ligament taken down with harmonic scalpel. The round ligament was also taken down with harmonic scalpel and the bladder flap created with that instrument to the midline. Attention was then turned to the left fallopian tube which was also reflected medially the harmonic scalpel which lies to remove the distal end of the fallopian tube which was no longer connected from her prior tubal ligation. This was removed to 5 mm port handed off to pathology.  The remainder of the fallopian tube was then released from the mesosalpinx and taken down to the level of the cornua the utero-ovarian ligament and broad ligament were likewise taken down with harmonic scalpel. Finally the round ligament was taken with harmonic scalpel and the bladder flap connected to the prior dissection in the midline. One small area on the right ovary was cauterized and good hemostasis obtained. All instruments were then removed from the abdomen and the ports covered with a sterile drape. Attention was turned vaginally where the cervix was grasped with [place] tenaculums 2. The cervix was also injected with a dilute solution of Pitressin circumferentially. The Bovie cautery was then utilized to make a circumferential incision on the vaginal mucosa surrounding the cervix this was further dissected with Mayo scissors. The posterior cul-de-sac was entered sharply and the banana speculum placed within it. The anterior cul-de-sac was likewise entered bluntly and the Deaver retractor placed within it. Thus with bladder and the rectum isolated the uterosacral ligaments were taken down bilaterally suture ligated with 0 Vicryl. The Zeppelin clamps were then utilized to take down the remaining paracervical tissue and uterine arteries each step was clamped and  suture ligated with 0 Vicryl. Finally the uterus was  able to be delivered from the vagina and the remaining pedicles were taken down with Zeppelin clamps and suture ligated with 0 Vicryl. The uterus cervix and tubes were handed off to pathology at this point. The posterior aspect of the cuff was then run with a 2-0 Vicryl in a running locked fashion for hemostasis. The uterosacral ligaments were reapproximated with 0 Vicryl and the cuff supported. The vaginal cuff was then closed with 2-0 Vicryl in a running fashion. All appeared hemostatic. Gowns and gloves were changed and attention was returned to the abdomen where pneumoperitoneum was once again obtained. The abdomen and pelvis appeared hemostatic with no active bleeding noted the cuff was irrigated as were the pedicles of the ovaries and all appear dry. At this point the 5 mm lateral ports were removed under direct visualization. The pneumoperitoneum was reduced through the medial port and this was likewise removed. All port sites were closed with a 3-0 Vicryl suture and liquid band.  The patient was then awakened and taken to the recovery room in good condition.

## 2015-02-28 NOTE — Anesthesia Postprocedure Evaluation (Signed)
Anesthesia Post Note  Patient: Mia Hunter  Procedure(s) Performed: Procedure(s) (LRB): LAPAROSCOPIC ASSISTED VAGINAL HYSTERECTOMY (N/A) BILATERAL SALPINGECTOMY (Bilateral)  Anesthesia type: General  Patient location: Mother/Baby  Post pain: Pain level controlled  Post assessment: Post-op Vital signs reviewed  Last Vitals:  Filed Vitals:   02/28/15 1140  BP: 116/63  Pulse: 81  Temp: 37 C  Resp: 17    Post vital signs: Reviewed  Level of consciousness: awake and alert   Complications: No apparent anesthesia complications

## 2015-02-28 NOTE — Anesthesia Procedure Notes (Signed)
Procedure Name: Intubation Date/Time: 02/28/2015 7:18 AM Performed by: Donnalee CurryMALINOVA, Liane Tribbey HRISTOVA Pre-anesthesia Checklist: Patient identified, Emergency Drugs available, Suction available and Patient being monitored Patient Re-evaluated:Patient Re-evaluated prior to inductionOxygen Delivery Method: Circle system utilized Preoxygenation: Pre-oxygenation with 100% oxygen Intubation Type: IV induction Ventilation: Mask ventilation without difficulty Laryngoscope Size: Mac and 3 Grade View: Grade II Tube type: Oral Tube size: 7.0 mm Number of attempts: 1 Airway Equipment and Method: Stylet Placement Confirmation: ETT inserted through vocal cords under direct vision,  positive ETCO2 and breath sounds checked- equal and bilateral Secured at: 20 cm Tube secured with: Tape Dental Injury: Teeth and Oropharynx as per pre-operative assessment

## 2015-02-28 NOTE — Anesthesia Postprocedure Evaluation (Signed)
  Anesthesia Post Note  Patient: Mia Hunter  Procedure(s) Performed: Procedure(s) (LRB): LAPAROSCOPIC ASSISTED VAGINAL HYSTERECTOMY (N/A) BILATERAL SALPINGECTOMY (Bilateral)  Anesthesia type: GA  Patient location: PACU  Post pain: Pain level controlled  Post assessment: Post-op Vital signs reviewed  Last Vitals:  Filed Vitals:   02/28/15 1000  BP: 115/64  Pulse: 59  Temp:   Resp: 19    Post vital signs: Reviewed  Level of consciousness: sedated  Complications: No apparent anesthesia complications

## 2015-02-28 NOTE — Addendum Note (Signed)
Addendum  created 02/28/15 1237 by Jhonnie GarnerBeth M Elia Nunley, CRNA   Modules edited: Notes Section   Notes Section:  File: [id number]

## 2015-03-01 ENCOUNTER — Encounter (HOSPITAL_COMMUNITY): Payer: Self-pay | Admitting: Obstetrics and Gynecology

## 2015-03-01 DIAGNOSIS — N938 Other specified abnormal uterine and vaginal bleeding: Secondary | ICD-10-CM | POA: Diagnosis not present

## 2015-03-01 LAB — CBC
HCT: 34.6 % — ABNORMAL LOW (ref 36.0–46.0)
Hemoglobin: 11.8 g/dL — ABNORMAL LOW (ref 12.0–15.0)
MCH: 30.6 pg (ref 26.0–34.0)
MCHC: 34.1 g/dL (ref 30.0–36.0)
MCV: 89.9 fL (ref 78.0–100.0)
Platelets: 222 10*3/uL (ref 150–400)
RBC: 3.85 MIL/uL — AB (ref 3.87–5.11)
RDW: 12.4 % (ref 11.5–15.5)
WBC: 10.5 10*3/uL (ref 4.0–10.5)

## 2015-03-01 LAB — BASIC METABOLIC PANEL
Anion gap: 4 — ABNORMAL LOW (ref 5–15)
BUN: 7 mg/dL (ref 6–23)
CO2: 29 mmol/L (ref 19–32)
CREATININE: 0.66 mg/dL (ref 0.50–1.10)
Calcium: 8.6 mg/dL (ref 8.4–10.5)
Chloride: 106 mmol/L (ref 96–112)
Glucose, Bld: 106 mg/dL — ABNORMAL HIGH (ref 70–99)
Potassium: 3.9 mmol/L (ref 3.5–5.1)
Sodium: 139 mmol/L (ref 135–145)

## 2015-03-01 MED ORDER — OXYCODONE-ACETAMINOPHEN 5-325 MG PO TABS
1.0000 | ORAL_TABLET | ORAL | Status: DC | PRN
  Administered 2015-03-01: 1 via ORAL
  Filled 2015-03-01: qty 1

## 2015-03-01 MED ORDER — IBUPROFEN 600 MG PO TABS
600.0000 mg | ORAL_TABLET | Freq: Four times a day (QID) | ORAL | Status: DC | PRN
  Administered 2015-03-01: 600 mg via ORAL
  Filled 2015-03-01: qty 1

## 2015-03-01 MED ORDER — IBUPROFEN 600 MG PO TABS: 600.0000 mg | ORAL_TABLET | Freq: Four times a day (QID) | ORAL | Status: AC | PRN

## 2015-03-01 MED ORDER — OXYCODONE-ACETAMINOPHEN 5-325 MG PO TABS: 1.0000 | ORAL_TABLET | ORAL | Status: DC | PRN

## 2015-03-01 NOTE — Progress Notes (Signed)
1 Day Post-Op Procedure(s) (LRB): LAPAROSCOPIC ASSISTED VAGINAL HYSTERECTOMY (N/A) BILATERAL SALPINGECTOMY (Bilateral)  Subjective: Patient reports tolerating PO.  She states her pain is well-controlled and has been able to ambulate  Objective: I have reviewed patient's vital signs, intake and output and labs.  General: alert and cooperative GI: soft NT  Incisions clean and intact  Assessment: s/p Procedure(s): LAPAROSCOPIC ASSISTED VAGINAL HYSTERECTOMY (N/A) BILATERAL SALPINGECTOMY (Bilateral): stable and progressing well  Plan: Advance to PO medication  D/c IV fluids Foley out D/c home this PM if ambulating and tolerating po meds     Kaizer Dissinger W 03/01/2015, 8:51 AM

## 2015-03-01 NOTE — Progress Notes (Signed)
Pt verbalizes understanding of d/c instructions, medications, follow up appts, when to seek medical care and belongings policy. Pt has no questions at this time. IV was d/c without complications. Pt escorted to main entrance, accompanied by myself. Pts husband is with her and will be driving her home. Pt has prescriptions and copy of d/c instructions. Sheryn BisonGordon, Tiarah Shisler Warner

## 2015-03-01 NOTE — Progress Notes (Signed)
Pt received 0.799mg  of dilaudid PCA prior to med being d/c.Marland Kitchen. [facility] due to discontinuation of medication from the computer system. Sheryn BisonGordon, Azelie Noguera Warner

## 2015-03-01 NOTE — Discharge Summary (Signed)
Physician Discharge Summary  Patient ID: Mia Hunter MRN: [id number] DOB/AGE: 41-41-1977 41 y.o.  Admit date: 02/28/2015 Discharge date: 03/01/2015  Admission Diagnoses: Pelvic pain                                         Abnormal uterine bleeding                                          Dysmenorrhea  Discharge Diagnoses:  Active Problems:   S/P laparoscopic assisted vaginal hysterectomy (LAVH)   Discharged Condition: good  Hospital Course: Pt admitted to observation following an LAVH/bilateral salpingectomy.  She did well and progressed to regular diet and po medications within 24 hours of surgery. Labs and UOP were WNL on d/c.    Treatments: surgery: LAVH/Bilateral salpingectomy  Discharge Exam: Blood pressure 114/74, pulse 60, temperature 98.2 F (36.8 C), temperature source Oral, resp. rate 19, height  (1.651 m), weight 77.111 kg (170 lb), SpO2 99 %. General appearance: alert and cooperative GI: soft NT, incisions clean and intact  Disposition:   Discharge Instructions    Diet - low sodium heart healthy    Complete by:  As directed      Discharge instructions    Complete by:  As directed   Avoid driving for at least 1-2 weeks or until off narcotic pain meds.  No heavy lifting greater than 10 lbs.  Nothing in vagina for 6 weeks.    Shower over incisions and pat dry.     Increase activity slowly    Complete by:  As directed             Medication List    STOP taking these medications        acyclovir 400 MG tablet  Commonly known as:  ZOVIRAX     etodolac 400 MG tablet  Commonly known as:  LODINE     methylphenidate 10 MG 24 hr capsule  Commonly known as:  RITALIN LA     pantoprazole 40 MG tablet  Commonly known as:  PROTONIX      TAKE these medications        ALPRAZolam 0.5 MG tablet  Commonly known as:  XANAX  TAKE 1/2 TO 1 TABLET BY MOUTH 3 TIMES A DAY AS NEEDED FOR NERVES     Biotin 1000 MCG tablet  Take 1,000 mcg by mouth daily.     esomeprazole 20 MG capsule  Commonly known as:  NEXIUM  Take 20 mg by mouth daily as needed (indigestion).     ibuprofen 600 MG tablet  Commonly known as:  ADVIL,MOTRIN  Take 1 tablet (600 mg total) by mouth every 6 (six) hours as needed for moderate pain.     lisdexamfetamine 50 MG capsule  Commonly known as:  VYVANSE  Take 50 mg by mouth daily.     oxyCODONE-acetaminophen 5-325 MG per tablet  Commonly known as:  PERCOCET/ROXICET  Take 1 tablet by mouth every 3 (three) hours as needed for moderate pain.     senna 8.6 MG tablet  Commonly known as:  SENOKOT  Take 2 tablets by mouth at bedtime.     valACYclovir 500 MG tablet  Commonly known as:  VALTREX  Take 500 mg by mouth daily.  Vitamin D-3 1000 UNITS Caps  Take 1,000 Units by mouth daily.           Follow-up Information    Follow up with Oliver PilaICHARDSON,Baker Kogler W, MD. Schedule an appointment as soon as possible for a visit in 2 weeks.   Specialty:  Obstetrics and Gynecology   Why:  incision check   Contact information:   [address] [place] [address] [phone]       Signed: Oliver PilaRICHARDSON,Illya Gienger W 03/01/2015, 8:58 AM

## 2015-08-26 ENCOUNTER — Telehealth: Payer: Self-pay | Admitting: Pulmonary Disease

## 2015-08-26 NOTE — Telephone Encounter (Signed)
Pt requesting ov with SN to discuss GI issues, interested in maybe being referred to GI upstairs.  Wants to discuss options with SN.  Pt scheduled for next available.  Nothing further needed .

## 2015-08-31 ENCOUNTER — Ambulatory Visit (INDEPENDENT_AMBULATORY_CARE_PROVIDER_SITE_OTHER): Payer: BLUE CROSS/BLUE SHIELD | Admitting: Pulmonary Disease

## 2015-08-31 ENCOUNTER — Encounter: Payer: Self-pay | Admitting: Pulmonary Disease

## 2015-08-31 ENCOUNTER — Encounter: Payer: Self-pay | Admitting: Internal Medicine

## 2015-08-31 VITALS — BP 122/74 | HR 75 | Temp 97.5°F | Wt 170.8 lb

## 2015-08-31 DIAGNOSIS — K589 Irritable bowel syndrome without diarrhea: Secondary | ICD-10-CM

## 2015-08-31 DIAGNOSIS — K5901 Slow transit constipation: Secondary | ICD-10-CM | POA: Diagnosis not present

## 2015-08-31 NOTE — Patient Instructions (Signed)
Today we updated your med list in our EPIC system...    Continue your current medications the same...  We will refer you to the GI Dept for further evaluation of your chronic constipation...  In the meanwhile- it is ok to continue the METAMUCIL, Stool softener, and the Glycerin suppositories as you are doing...  Call for any questions.Marland KitchenMarland Kitchen

## 2015-08-31 NOTE — Progress Notes (Signed)
Subjective:    Patient ID: Mia Hunter, female    DOB: 1976-07-09, 39 y.o.   MRN: [id number]  HPI 39 y/o WF here for a follow up visit, she is the daughter of Vidal Schwalbe... ~  SEE PREV EPIC NOTES FOR OLDER DATA >>    LABS 1/14:  FLP- at goals on diet alone;  Chems- wnl;  CBC- wnl;  TSH=1.47   ~  January 27, 2014:  94mo[place]Conesvillenotes that her constipation improved w/ Linzess but it made her stomach hurt so she stopped & uses it prn (try the Linzess145); also c/o epig pain/ burning & rec to take OTC Prilosec20... We reviewed the following medical problems during today's office visit >>     Ex-smoker> she quit for good in 2010; denies cough, sput, hemoptysis, SOB, etc...    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     Hx PMDD> followed by DBeverley Fiedler prev on Yaz & nuvaring but off all meds now; she has had a BTL in past...    Hx LBP> on Etodolac 4035mBid as needed...    Hx ?RLS> these symptoms pretty much resolved off the psychotopic meds...    Anxiety & Depression> she [facility]/ [facility] She has been tried on mult meds eg.Abilify and she states mult side effects like worse anxiety, incr agoraphobia, & restless leg symptoms;  Pt decided to stop all these meds & just use the Alpraz prn=> she reports much improved overall... We reviewed prob list, meds, xrays and labs> see below for updates >> she refuses the seasonal flu shots...   CXR 2/15 showed norm heart size, clear lungs, NAD...   ~  December 31, 2014:  1159moV & Naly presents w/ continued GI issues> c/o severe acid reflux and chronic constipation; she has been taking OTC PPI meds but they cost her $50 per month & she wonders if there is a cheaper prescription option (we discussed Pantoprazole40 once or twice daily); she tried Linzess last yr for the constipation but both doses  caused adb discomfort & cramping- we reviewed IBS-c symptoms and offered additional meds vs GI eval but she declined; she still goes once once or twice per week but denies abd distention, n/v, etc; she has tried [employer] Miningt is vague about their efficacy; I gave her samples of AMITIZA 8mg66mily & she will call for Rx if helpful... We reviewed the following medical problems during today's office visit >>     Ex-smoker> she quit for good in 2010; denies cough, sput, hemoptysis, SOB, etc; chest is clear, no resp infections etc...    IBS-C> as above- she has declined GI referral for further eval; I have rec MIRALAX daily & SENAKOT-S 2 Qhs; she has been intol to Linzess in both doses due to abd discomfort she says- try AMITIZA 8mg/75mamples & she will call us...[place]  Hx PMDD> followed by DrHavBeverley Fiedlerv [place]az [place] now; she has had a BTL in past & she tells me she is sched for a hysteresctomy by DrRichardson due to fibroids; she is hopeful that the surg will help all symptoms.     Hx LBP> on Etodolac 400mg 40mas needed & doing satis she says...    Hx ?RLS> these symptoms pretty much  resolved off the psychotopic meds...    Anxiety & Depression, ?Bipolar, ?Adult ADD> she was diagnosed Bipolar & agoraphobic w/ panic by [facility] & Jessica Priest;  She has been tried on mult meds but she states mult side effects like worse anxiety, incr agoraphobia, restless leg symptoms;  She is now followed by Eino Farber PA at [place] Dx w/ Adult ADD, started on Vyvance40 plus her Alpraz0.5Tid prn...  We reviewed prob list, meds, xrays and labs> see below for updates >>   LABS 1/16: FLP- wnl on diet alone;  Chems- all wnl;  CBC- wnl;  TSH=3.85... PLAN>> she will ret for FASTING blood work (all WNL), continue same meds but try regular dosing of Miralax (once daily) & Senakot-S (2Qhs); we gave her 3 wks of Amitiza80m to take one daily & she will call for Rx if helpful; otherwise I have  encouraged GI referral for further eval & she will consider this; she is hopeful that the planned hysterectomy will improve her chr constipation...   ~  August 31, 2015:  83moOV & add-on appt requested for continued GI complaints of abd discomfort & constipation (see above);  After her last visit Mia tells me that she didn't tolerate the Miralax (it made her feel bloated and uncomfortable), or the Amitiza8m58mit caused cramping and ?diarrhea?);  She had surg 3/16 by DrRichardson GYN w/ laparoscopic assisted vaginal hysterectomy & BSO; after the procedure her constip was no better (maybe sl worse she says);  Currently she is on a regimen w/ Metamucil, stool softeners, and glycerin suppos=> this works and allows her to go...  EXAM reveals Afeb, VSS, O2sat=100% on RA;  HEENT- neg;  Chest- clear w/o w/r/r;  Heart- RR w/o m/r/g;  Abd- soft, nontender, norm BS;  Ext- neg w/o c/c/e... IMP/PLAN>>  Hunter has IBS-c and has had a difficult time establishing a regular schedule; she has agreed to GI consult for further eval & we will refer; in the meanwhile she will continue her Metamucil, stool softeners, and glycerin suppos...             Problem List:    Ex- CIGARETTE SMOKER (ICD-305.1) - smoked 1/2 ppd & was told to quit by GYN- had to stop the YAZ Rx at age [age]  denies cough, phlegm, SOB, CP, etc... ~  7/10:  discussed smoking cessation strategies including cessation programs, counselling, nicotine replacement, and Chantix receptor blockade... she declined help. ~  9/11:  now states that she has decided to quit & want to try Wellbutrin Rx & E-cigs, +rec to consider counselling (she declines), Nicotine replacement, etc... ~  9/12:  Pt states the Wellbutrin reacted on her & didn't tolerate; restarted Zoloft & quit smoking on her own she says, now back on YAZ per Gyn... ~  CXR 9/12 showed norm heart size, clear lungs, NAD...  ~  EKG 9/12 showed NSR, rate62, wnl, NAD...  ~  1/16:  She remains smoke free &  asymptomatic...  GERD >>  ~  1/16:  She has been taking OTC PPI meds but they are too $$$ she says; offered PANTOPRAZOLE 23m81m2 daily and rec to have further GI eval but she wants to wait... ~  9/16:  She notes that the Protonix works ok for her but she has to take it daily w/ incr reflux symptoms if she skips a day...  IRRITABLE BOWEL SYNDROME (ICD-564.1), & CONSTIPATION (ICD-564.00) - Hx IBS w/ constipation predominent form... states BMs once weekly- firm  to hard, sometimes w/ liq stool to follow... denies abd pain, N/V, blood seen, etc... prev eval by [name] w/ colonoscopy 2001= neg... ~  7/10:  we discussed Rx w/ Metamucil once or twice daily, fluids, etc > no better she says. ~  9/11:  advised to seek help of GI but she declines appt> therefore try combination MIRALAX, SENAKOT-S, & she likes cereal fiber bars... ~  9/12:  She reports improved & uses the laxatives just as needed now... ~  1/14:  Encouraged to use the Miralax & Senakot-s more regularly for her constipation... ~  7/14:  She tells me she never tried the Miralax/ Senakot-S but is using Metamucil/ Colase but these are not working for her constip & she goes once per wk; Rec- Linzess 271m/d, next step= refer to GI... ~  2/15:  The Linzess was too strong & caused abd cramping; Rec to try Linzess 145 daily & f/u w/ GI... ~  1/16:  She states that both doses of Linzess caused abd pain/ cramping & she stopped it; tried Miralax/ Senakot-S but vague about response; asked to restart these regularly, try AMITIZA 824md samples & needs f/u w/ GI... ~  9/16: she notes intol to Miralax, Amtiza; she is defecating ok on Metamucil, stool softeners, and glycerin suppos; we will refer to GI for further eval...  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 she will discuss further w/ DrHarvett soon... ~  She reports that she has  ovarian cysts and sm uterine fibroid being followed by [name]... ~  GYN also prescribes ZOVIRAX tabs & ointment for as needed use... ~  1/16:  She tells me that DrRichardson is going to do a hysterectomy soon (Fibroids); she is hopefull that all symptoms and her constipation will improve post-op... ~  3/16: s/p laparoscopic assisted vaginal hyst and BSO by DrRichardson...  BACK PAIN, LUMBAR (ICD-724.2) - she uses ETODOLAC 40039mid Prn...  C/O Ankle Edema & ?RLS >> presented 6/13 for add-on visit due to intermittent ankle edema & complaints of uncomfortable movement  In legs & arms (see above); we discussed NO SALT etc & trial MIRAPEX for the RLS (she reports this helped, but symptoms mostly resolved off the Abilify & psychotropics)...   ANXIETY (ICD-300.00), & DEPRESSION (ICD-311) - on ALPRAZOLAM 0.5mg36md Prn... she notes that she uses the Xanax mostly around her menses... ~  She was [facility] & sees NP LeslMagda Paganinieal> prescribed ABILIFY 15mg42m2 tab daily & pt says improved... ~  1/14:  she was diagnosed Bipolar disorder & agoraphobia w/ panic by Family services [name] & JanetJessica Prieste has been tried on mult meds- eg. Abilify and she states mult side effects like worse anxiety, incr agoraphobia, & restless leg symptoms;  Pt decided to stop all these meds & just use the Alpraz prn=> she reports much improved overall... ~  7/14:  Last visit we added Prozac20mg/54mr depression at her request (see below); she tells me that she took it for about 75mo & 69mo't notice any improvement ("no better, no worse") so she stopped it; offered an SNRI trial & she [facility] if this makes a diff for her; next step= refer back to [facility] for their Rx... 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 nothing ever determined and symptoms resolved without recurrence... allergy testing by DrESL was neg- IgE level = 11...   HEALTH MAINTENANCE: she quit smoking 2010... ~  GI:  Followed by Demetra Shiner, known IBS-c, had neg colonoscopy 2001 & needs follow up eval w/ GI... ~  GYN:  Followed by [facility] for gyn & sched for hysterectomy in 2016. ~  Immuniz:  She declines the flu shot;  ?when her last tetanus shot was given...    Past Surgical History  Procedure Laterality Date  . Hernia repair  10/2010    done by [facility]  . Wisdom tooth extraction    . Tubal ligation    . Laparoscopic assisted vaginal hysterectomy N/A 02/28/2015    Procedure: LAPAROSCOPIC ASSISTED VAGINAL HYSTERECTOMY;  Surgeon: Paula Compton, MD;  Location: [place] [facility];  Service: Gynecology;  Laterality: N/A;  . Bilateral salpingectomy Bilateral 02/28/2015    Procedure: BILATERAL SALPINGECTOMY;  Surgeon: Paula Compton, MD;  Location: [place] [facility];  Service: Gynecology;  Laterality: Bilateral;    Outpatient Encounter Prescriptions as of 08/31/2015  Medication Sig  . ALPRAZolam (XANAX) 0.5 MG tablet TAKE 1/2 TO 1 TABLET BY MOUTH 3 TIMES A DAY AS NEEDED FOR NERVES  . Biotin 1000 MCG tablet Take 1,000 mcg by mouth daily.  . Cholecalciferol (VITAMIN D-3) 1000 UNITS CAPS Take 1,000 Units by mouth daily.  Marland Kitchen ibuprofen (ADVIL,MOTRIN) 600 MG tablet Take 1 tablet (600 mg total) by mouth every 6 (six) hours as needed for moderate pain.  Marland Kitchen lisdexamfetamine (VYVANSE) 50 MG capsule Take 50 mg by mouth daily.  Marland Kitchen oxyCODONE-acetaminophen (PERCOCET/ROXICET) 5-325 MG per tablet Take 1 tablet by mouth every 3 (three) hours as needed for moderate pain.  . valACYclovir (VALTREX) 500 MG tablet Take 500 mg by mouth daily.  Marland Kitchen esomeprazole (NEXIUM) 20 MG capsule Take 20 mg by mouth daily as needed (indigestion).  . pantoprazole (PROTONIX) 40 MG tablet   . senna  (SENOKOT) 8.6 MG tablet Take 2 tablets by mouth at bedtime.    No facility-administered encounter medications on file as of 08/31/2015.    No Known Allergies  Current Medications, Allergies, Past Medical History, Past Surgical History, Family History, and Social History were reviewed in [employer] record.    Review of Systems         The patient complains of depression and anxiety; reflux and constipation.  The patient denies fever, chills, sweats, anorexia, fatigue, weakness, malaise, weight loss, sleep disorder, blurring, diplopia, eye irritation, eye discharge, vision loss, eye pain, photophobia, earache, ear discharge, tinnitus, decreased hearing, nasal congestion, nosebleeds, sore throat, hoarseness, chest pain, palpitations, syncope, dyspnea on exertion, orthopnea, PND, peripheral edema, cough, dyspnea at rest, excessive sputum, hemoptysis, wheezing, pleurisy, nausea, vomiting, diarrhea, change in bowel habits, melena, hematochezia, jaundice, gas/bloating, indigestion/heartburn, dysphagia, odynophagia, dysuria, hematuria, urinary frequency, urinary hesitancy, nocturia, incontinence, back pain, joint pain, joint swelling, muscle cramps, muscle weakness, stiffness, arthritis, sciatica, restless legs, leg pain at night, leg pain with exertion, rash, itching, dryness, suspicious lesions, paralysis, paresthesias, seizures, tremors, vertigo, transient blindness, frequent falls, frequent headaches, difficulty walking, memory loss, confusion, cold intolerance, heat intolerance, polydipsia, polyphagia, polyuria, unusual weight change, abnormal bruising, bleeding, enlarged lymph nodes, urticaria, allergic rash, hay fever, and recurrent infections.     Objective:   Physical  Exam    WD, WN, 39 y/o WF in NAD... GENERAL:  Alert & oriented; pleasant & cooperative... HEENT:  [place]/AT, EOM-wnl, PERRLA, Fundi-benign, EACs-clear, TMs-wnl, NOSE-clear, THROAT-clear & wnl. NECK:  Supple w/  full ROM; no JVD; normal carotid impulses w/o bruits; no thyromegaly or nodules palpated; no lymphadenopathy. CHEST:  Clear to P & A; without wheezes/ rales/ or rhonchi. HEART:  Regular Rhythm; without murmurs/ rubs/ or gallops. ABDOMEN:  Soft & nontender; normal bowel sounds; no organomegaly or masses detected. EXT: without deformities or arthritic changes; no varicose veins/ venous insuffic/ or edema. NEURO:  CN's intact; motor testing normal; sensory testing normal; gait normal & balance OK. DERM:  No lesions noted; no rash etc...  RADIOLOGY DATA:  Reviewed in the EPIC EMR & discussed w/ the patient...  LABORATORY DATA:  Reviewed in the EPIC EMR & discussed w/ the patient...   Assessment & Plan:    GERD>  She is c/o persistent GERD/ reflux & using OTC meds but too $$; we discussed trial PANTOPRAZOLE 62m up to Bid and f/u eval by GI...  IBS/ Constip>  Constip continues but appears better on Metamucil, stool softeners, and glycerin suppos; we will refer to GI for their eval...  PMDD, Ovarian cysts, Uterine fibroid>  All followed by [name]/ RMarvel Plan GYN => lap assisted hysterectomy & BSO 3/16  Hx LBP>  She uses Lodine as needed; she notes that her LBP radiates to legs & esp around her menses...  Anxiety/ Psyche>  she uses the Alpraz as needed; now seeing [facility]- JoHughes on Vyvance (for new dx of Adult ADD) and prn alpraz... Other medical issues as noted...   Patient's Medications  New Prescriptions                                                       Use METAMUCIL, Stool Softeners, Glycerin suppos...   Previous Medications   ALPRAZOLAM (XANAX) 0.5 MG TABLET    TAKE 1/2 TO 1 TABLET BY MOUTH 3 TIMES A DAY AS NEEDED FOR NERVES   BIOTIN 1000 MCG TABLET    Take 1,000 mcg by mouth daily.   CHOLECALCIFEROL (VITAMIN D-3) 1000 UNITS CAPS    Take 1,000 Units by mouth daily.   ESOMEPRAZOLE (NEXIUM) 20 MG CAPSULE    Take 20 mg by mouth daily as needed (indigestion).    IBUPROFEN (ADVIL,MOTRIN) 600 MG TABLET    Take 1 tablet (600 mg total) by mouth every 6 (six) hours as needed for moderate pain.   LISDEXAMFETAMINE (VYVANSE) 50 MG CAPSULE    Take 50 mg by mouth daily.   OXYCODONE-ACETAMINOPHEN (PERCOCET/ROXICET) 5-325 MG PER TABLET    Take 1 tablet by mouth every 3 (three) hours as needed for moderate pain.   PANTOPRAZOLE (PROTONIX) 40 MG TABLET       SENNA (SENOKOT) 8.6 MG TABLET    Take 2 tablets by mouth at bedtime.    VALACYCLOVIR (VALTREX) 500 MG TABLET    Take 500 mg by mouth daily.  Modified Medications   No medications on file  Discontinued Medications   No medications on file

## 2015-11-15 ENCOUNTER — Ambulatory Visit (INDEPENDENT_AMBULATORY_CARE_PROVIDER_SITE_OTHER): Payer: BLUE CROSS/BLUE SHIELD | Admitting: Internal Medicine

## 2015-11-15 ENCOUNTER — Encounter: Payer: Self-pay | Admitting: Internal Medicine

## 2015-11-15 VITALS — BP 112/78 | HR 72 | Ht 65.0 in | Wt 175.0 lb

## 2015-11-15 DIAGNOSIS — K589 Irritable bowel syndrome without diarrhea: Secondary | ICD-10-CM | POA: Diagnosis not present

## 2015-11-15 DIAGNOSIS — K5909 Other constipation: Secondary | ICD-10-CM

## 2015-11-15 DIAGNOSIS — K5900 Constipation, unspecified: Secondary | ICD-10-CM

## 2015-11-15 NOTE — Progress Notes (Signed)
  Referred by Alroy DustScott Nadel. MD  Subjective:    Patient ID: Mia Hunter, female    DOB: July 06, 1976, 39 y.o.   MRN: [id number] Cc: constipation HPI Pleasant middle aged ww here with many years of constiopation and abdominal bloating and pain. She has tried Amitiza, Linzess, MiraLAx and stimulant laxatives. Currently using intermittent suppositories to stimulate defecation as well as Metamucil/stool softeners (Sennokot).  Tends to strain a great deal and does not get much urge to defecate. Mostly defecates on weekend but not during week. No bleeding. Had colonoscopy yrs ago negative Arlyce Dice- Kaplan.  Medications, allergies, past medical history, past surgical history, family history and social history are reviewed and updated in the EMR.  Review of Systems Some insomnia, fatigue All other ROS negative    Objective:   Physical Exam  @BP  112/78 mmHg  Pulse 72  Ht 5\' 5"  (1.651 m)  Wt 175 lb (79.379 kg)  BMI 29.12 kg/m2@  General:  Well-developed, well-nourished and in no acute distress Eyes:  anicteric. ENT:   Mouth and posterior pharynx free of lesions.  Neck:   supple w/o thyromegaly or mass.  Lungs: Clear to auscultation bilaterally. Heart:  S1S2, no rubs, murmurs, gallops. Abdomen:  soft, non-tender, no hepatosplenomegaly, hernia, or mass and BS+.  Rectal:   Anoderm inspection revealed normal findings Anal wink was absent Digital exam revealed normal resting tone and voluntary squeeze. No mass or rectocele present. Simulated defecation with valsalva revealed appropriate abdominal contraction and descent.  Lymph:  no cervical or supraclavicular adenopathy. Extremities:   no edema, cyanosis or clubbing Skin   no rash. Neuro:  A&O x 3.  Psych:  appropriate mood and  Affect.  Data Reviewed: PCP notes, labs in EMR    Assessment & Plan:  Chronic constipation  IBS (irritable bowel syndrome)   Sitz marks study to evaluate colonic transit Anorectal manometry to evaluate  anorectal fx  F/u after that  I appreciate the opportunity to care for this patient. WU:JWJXB,JYNWGCc:NADEL,SCOTT M, MD

## 2015-11-15 NOTE — Patient Instructions (Addendum)
You have been scheduled to [facility] at 12:30pm. Please arrive 30 minutes prior to your appointment time for registration (1st floor of the hospital-admissions).  Please make certain to use 1 Fleets enema 2 hours prior to coming for your appointment. You can purchase Fleets enemas from the laxative section at your drug store. You should not eat anything during the two hours prior to the procedure. You may take regular medications with small sips of water at least 2 hours prior to the study.  Anorectal manometry is a test performed to evaluate patients with constipation or fecal incontinence. This test measures the pressures of the anal sphincter muscles, the sensation in the rectum, and the neural reflexes that are needed for normal bowel movements.  THE PROCEDURE The test takes approximately 30 minutes to 1 hour. You will be asked to change into a hospital gown. A technician or nurse will explain the procedure to you, take a brief health history, and answer any questions you may have. The patient then lies on his or her left side. A small, flexible tube, about the size of a thermometer, with a balloon at the end is inserted into the rectum. The catheter is connected to a machine that measures the pressure. During the test, the small balloon attached to the catheter may be inflated in the rectum to assess the normal reflex pathways. The nurse or technician may also ask the person to squeeze, relax, and push at various times. The anal sphincter muscle pressures are measured during each of these maneuvers. To squeeze, the patient tightens the sphincter muscles as if trying to prevent anything from coming out. To push or bear down, the patient strains down as if trying to have a bowel movement.  Please come back to the office 11/22/2015 to pick up the sitz maker pill.  Then come back on Monday, 11/27/2015 and go directly to X-ray to have the KUB.

## 2015-11-16 ENCOUNTER — Encounter: Payer: Self-pay | Admitting: Internal Medicine

## 2015-11-23 ENCOUNTER — Telehealth: Payer: Self-pay

## 2015-11-23 NOTE — Telephone Encounter (Signed)
Patient came in today and took her sitz marker around 3:50pm , swallowed it without difficulty.  She will come back on Monday Dec. 5th to get her KUB x-ray.

## 2015-11-28 ENCOUNTER — Telehealth: Payer: Self-pay | Admitting: Internal Medicine

## 2015-11-28 ENCOUNTER — Ambulatory Visit (INDEPENDENT_AMBULATORY_CARE_PROVIDER_SITE_OTHER)
Admission: RE | Admit: 2015-11-28 | Discharge: 2015-11-28 | Disposition: A | Discharge status: Home / Self Care | Payer: BLUE CROSS/BLUE SHIELD | Source: Ambulatory Visit | Attending: Internal Medicine | Admitting: Internal Medicine

## 2015-11-28 DIAGNOSIS — K589 Irritable bowel syndrome without diarrhea: Secondary | ICD-10-CM

## 2015-11-28 DIAGNOSIS — K5900 Constipation, unspecified: Secondary | ICD-10-CM | POA: Diagnosis not present

## 2015-11-28 DIAGNOSIS — K5909 Other constipation: Secondary | ICD-10-CM

## 2015-11-28 NOTE — Telephone Encounter (Signed)
Left message to call me back, she's due for her KUB x-ray today.

## 2015-11-29 NOTE — Progress Notes (Signed)
Quick Note:  My Chart note Half of sitz marks still in there Waiting for anorectal manometry ______

## 2015-12-17 ENCOUNTER — Other Ambulatory Visit: Payer: Self-pay | Admitting: Pulmonary Disease

## 2016-01-02 ENCOUNTER — Encounter (HOSPITAL_COMMUNITY)
Admission: RE | Disposition: A | Discharge status: Home / Self Care | Payer: Self-pay | Source: Ambulatory Visit | Attending: Internal Medicine

## 2016-01-02 ENCOUNTER — Ambulatory Visit (HOSPITAL_COMMUNITY)
Admission: RE | Admit: 2016-01-02 | Discharge: 2016-01-02 | Disposition: A | Discharge status: Home / Self Care | Payer: BLUE CROSS/BLUE SHIELD | Source: Ambulatory Visit | Attending: Internal Medicine | Admitting: Internal Medicine

## 2016-01-02 DIAGNOSIS — K5900 Constipation, unspecified: Secondary | ICD-10-CM | POA: Diagnosis not present

## 2016-01-02 HISTORY — PX: ANAL RECTAL MANOMETRY: SHX6358

## 2016-01-02 SURGERY — MANOMETRY, ANORECTAL

## 2016-01-03 ENCOUNTER — Encounter (HOSPITAL_COMMUNITY): Payer: Self-pay | Admitting: Internal Medicine

## 2016-01-10 ENCOUNTER — Other Ambulatory Visit: Payer: Self-pay | Admitting: Pulmonary Disease

## 2016-01-17 ENCOUNTER — Telehealth: Payer: Self-pay | Admitting: Internal Medicine

## 2016-01-17 NOTE — Telephone Encounter (Signed)
Patient notified we will call her as soon as we have the results and plans

## 2016-01-17 NOTE — Telephone Encounter (Signed)
Have you seen the results of her ano rectal mano?

## 2016-01-17 NOTE — Telephone Encounter (Signed)
Dr. Lavon Paganini has it on her list to read - hopefulyy this week I have cced her

## 2016-01-19 ENCOUNTER — Other Ambulatory Visit: Payer: Self-pay | Admitting: *Deleted

## 2016-01-19 MED ORDER — ALPRAZOLAM 0.5 MG PO TABS: ORAL_TABLET | ORAL | Status: DC

## 2016-01-19 NOTE — Telephone Encounter (Signed)
Rx for Alprazolam .  tab called into [facility]  Nothing further needed.

## 2016-01-19 NOTE — Telephone Encounter (Signed)
Received refill request for alprazolam 0.5 mg tab Last refilled 12/31/14  Take 1/2-1 tab po TID PRN #90 x 5 refills  Please advise SN thanks

## 2016-01-19 NOTE — Telephone Encounter (Signed)
Left message for patient to call back Dr. Leone Payor are there any new recommendations/plans?

## 2016-01-19 NOTE — Telephone Encounter (Signed)
Sheri, please inform patient anorectal manometry did not show any significant abnormality, was normal study. Thanks

## 2016-01-20 MED ORDER — LACTULOSE 20 GM/30ML PO SOLN: ORAL | Status: DC

## 2016-01-20 NOTE — Telephone Encounter (Signed)
Left message for patient to call back  

## 2016-01-20 NOTE — Addendum Note (Signed)
Addended by: Annett Fabian on: 01/20/2016 08:51 AM   Modules accepted: Orders

## 2016-01-20 NOTE — Telephone Encounter (Signed)
1) Try lactulose 1-2 tablespoons a day - please calculate amount she will need for 1 month Rx and Rx that + 2 RF 2) Explain that it can make her bloated and gassy - if it does and causes problems stop it but its worth a try 3) Schedule non-urgent REV

## 2016-01-20 NOTE — Telephone Encounter (Signed)
Patient notified Follow up scheduled for 03/19/16 rx sent to her pharmacy.  She will call back for any additional questions or concerns

## 2016-03-19 ENCOUNTER — Ambulatory Visit (INDEPENDENT_AMBULATORY_CARE_PROVIDER_SITE_OTHER): Payer: BLUE CROSS/BLUE SHIELD | Admitting: Internal Medicine

## 2016-03-19 ENCOUNTER — Encounter: Payer: Self-pay | Admitting: Internal Medicine

## 2016-03-19 VITALS — BP 114/80 | HR 84 | Ht 65.25 in | Wt 171.2 lb

## 2016-03-19 DIAGNOSIS — K5901 Slow transit constipation: Secondary | ICD-10-CM | POA: Diagnosis not present

## 2016-03-19 NOTE — Patient Instructions (Signed)
  Try Dulcolax or Senokot 1-2 tablets every other night.  Continue your lactulose.   Please MyChart message us with an update.    I appreciate the opportunity to care for you.

## 2016-03-20 ENCOUNTER — Encounter: Payer: Self-pay | Admitting: Internal Medicine

## 2016-03-20 NOTE — Progress Notes (Signed)
   Subjective:    Patient ID: Mia Hunter, female    DOB: [birth year], 40 y.o.   MRN: [id number] Cc: constipation HPI Here for f/u after NL anorectal manometry - still has infrequent defaction adn bloating problems. Glycerin suppository helps stimulate defecation. Had  8 sitzmarks retained throughout colon.  She has not responded to MiraLax, Linzess, Amitiza. Lactulose taken now not really helping.  Medications, allergies, past medical history, past surgical history, family history and social history are reviewed and updated in the EMR.    Review of Systems As above    Objective:   Physical Exam BP 114/80 mmHg  Pulse 84  Ht 5' 5.25" (1.657 m)  Wt 171 lb 4 oz (77.678 kg)  BMI 28.29 kg/m2  LMP 02/15/2015 (Exact Date) NAD    Assessment & Plan:  Slow transit constipation  She will use stimulant laxatives like bisacodyl or Senokot 1-2 every other night and stay on lactulose.  We talked about Smart pill study and consideration for subtotal colectomy in future as possibilities also.  She will update me by phone or My Chart.  15 minutes time spent with patient > half in counseling coordination of care

## 2016-08-11 ENCOUNTER — Other Ambulatory Visit: Payer: Self-pay | Admitting: Pulmonary Disease

## 2016-11-23 DIAGNOSIS — M7651 Patellar tendinitis, right knee: Secondary | ICD-10-CM | POA: Diagnosis not present

## 2016-11-23 DIAGNOSIS — M222X1 Patellofemoral disorders, right knee: Secondary | ICD-10-CM | POA: Diagnosis not present

## 2016-11-23 DIAGNOSIS — M25561 Pain in right knee: Secondary | ICD-10-CM | POA: Diagnosis not present

## 2016-12-03 DIAGNOSIS — F909 Attention-deficit hyperactivity disorder, unspecified type: Secondary | ICD-10-CM | POA: Diagnosis not present

## 2016-12-03 DIAGNOSIS — F39 Unspecified mood [affective] disorder: Secondary | ICD-10-CM | POA: Diagnosis not present

## 2016-12-10 ENCOUNTER — Other Ambulatory Visit: Payer: Self-pay | Admitting: Pulmonary Disease

## 2017-03-06 ENCOUNTER — Telehealth: Payer: Self-pay | Admitting: Pulmonary Disease

## 2017-03-06 NOTE — Telephone Encounter (Signed)
Patient is calling to request to switch PCP's from Dr[facility] Alroy DustScott Nadel [facility].  Please respond at your earliest convenience to acknowledge the patients request.  Thank you,  -LL

## 2017-03-06 NOTE — Telephone Encounter (Signed)
I would be happy to accept this patient however, chart review reveals that she is on controlled substances including xanax and vyvanse. I do not write for these medications, however Dr. Helane RimaErica Wallace does, if she feels as though it is appropriate for the patient to be on these medications. If she is needing those medications managed, I think she would be better suited to establish care with Dr. Helane RimaErica Wallace.

## 2017-03-07 NOTE — Telephone Encounter (Signed)
I'm okay to accept.

## 2017-03-13 ENCOUNTER — Telehealth: Payer: Self-pay | Admitting: *Deleted

## 2017-03-13 NOTE — Telephone Encounter (Signed)
PreVisit Call attempted. Pt was walking in to work. Agreed to fill out Pre Patient Packet today.

## 2017-03-14 ENCOUNTER — Ambulatory Visit (INDEPENDENT_AMBULATORY_CARE_PROVIDER_SITE_OTHER): Payer: BLUE CROSS/BLUE SHIELD | Admitting: Physician Assistant

## 2017-03-14 ENCOUNTER — Encounter: Payer: Self-pay | Admitting: Physician Assistant

## 2017-03-14 ENCOUNTER — Encounter: Payer: Self-pay | Admitting: *Deleted

## 2017-03-14 VITALS — BP 130/86 | HR 69 | Temp 98.7°F | Ht 65.0 in | Wt 175.0 lb

## 2017-03-14 DIAGNOSIS — F419 Anxiety disorder, unspecified: Secondary | ICD-10-CM

## 2017-03-14 DIAGNOSIS — R638 Other symptoms and signs concerning food and fluid intake: Secondary | ICD-10-CM | POA: Diagnosis not present

## 2017-03-14 DIAGNOSIS — K5901 Slow transit constipation: Secondary | ICD-10-CM

## 2017-03-14 DIAGNOSIS — Z0001 Encounter for general adult medical examination with abnormal findings: Secondary | ICD-10-CM | POA: Diagnosis not present

## 2017-03-14 DIAGNOSIS — R5383 Other fatigue: Secondary | ICD-10-CM | POA: Diagnosis not present

## 2017-03-14 DIAGNOSIS — F988 Other specified behavioral and emotional disorders with onset usually occurring in childhood and adolescence: Secondary | ICD-10-CM

## 2017-03-14 DIAGNOSIS — K219 Gastro-esophageal reflux disease without esophagitis: Secondary | ICD-10-CM

## 2017-03-14 DIAGNOSIS — Z23 Encounter for immunization: Secondary | ICD-10-CM | POA: Diagnosis not present

## 2017-03-14 DIAGNOSIS — F320 Major depressive disorder, single episode, mild: Secondary | ICD-10-CM | POA: Diagnosis not present

## 2017-03-14 NOTE — Assessment & Plan Note (Signed)
Managed by Dr. Tamela OddiJo Hughes, stable on lamictal.

## 2017-03-14 NOTE — Assessment & Plan Note (Signed)
Managed by Dr. Tamela OddiJo Hughes, stable on Lamictal.

## 2017-03-14 NOTE — Progress Notes (Signed)
Pre visit review using our clinic review tool, if applicable. No additional management support is needed unless otherwise documented below in the visit note. 

## 2017-03-14 NOTE — Assessment & Plan Note (Signed)
Well controlled with Protonix ?

## 2017-03-14 NOTE — Assessment & Plan Note (Signed)
Sees Dr. Stan Headarl Gessner when necessary. She takes senna and lactulose as needed. This is a chronic issue for patient

## 2017-03-14 NOTE — Patient Instructions (Signed)
It was great meeting you today!  You may take 60m Melatonin nightly to try to help with sleep.  Please make an appointment with the lab on your way out. I would like for you to return for lab work within 1 week. After midnight of the day of your lab draw, please do not eat anything. You may have water, black coffee, unsweetened tea.   Health Maintenance, Female Adopting a healthy lifestyle and getting preventive care can go a long way to promote health and wellness. Talk with your health care provider about what schedule of regular examinations is right for you. This is a good chance for you to check in with your provider about disease prevention and staying healthy. In between checkups, there are plenty of things you can do on your own. Experts have done a lot of research about which lifestyle changes and preventive measures are most likely to keep you healthy. Ask your health care provider for more information. Weight and diet Eat a healthy diet  Be sure to include plenty of vegetables, fruits, low-fat dairy products, and lean protein.  Do not eat a lot of foods high in solid fats, added sugars, or salt.  Get regular exercise. This is one of the most important things you can do for your health.  Most adults should exercise for at least 150 minutes each week. The exercise should increase your heart rate and make you sweat (moderate-intensity exercise).  Most adults should also do strengthening exercises at least twice a week. This is in addition to the moderate-intensity exercise. Maintain a healthy weight  Body mass index (BMI) is a measurement that can be used to identify possible weight problems. It estimates body fat based on height and weight. Your health care provider can help determine your BMI and help you achieve or maintain a healthy weight.  For females 235years of age and older:  A BMI below 18.5 is considered underweight.  A BMI of 18.5 to 24.9 is normal.  A BMI of 25 to  29.9 is considered overweight.  A BMI of 30 and above is considered obese. Watch levels of cholesterol and blood lipids  You should start having your blood tested for lipids and cholesterol at 43years of age, then have this test every 5 years.  You may need to have your cholesterol levels checked more often if:  Your lipid or cholesterol levels are high.  You are older than 43years of age.  You are at high risk for heart disease. Cancer screening Lung Cancer  Lung cancer screening is recommended for adults 591844years old who are at high risk for lung cancer because of a history of smoking.  A yearly low-dose CT scan of the lungs is recommended for people who:  Currently smoke.  Have quit within the past 15 years.  Have at least a 30-pack-year history of smoking. A pack year is smoking an average of one pack of cigarettes a day for 1 year.  Yearly screening should continue until it has been 15 years since you quit.  Yearly screening should stop if you develop a health problem that would prevent you from having lung cancer treatment. Breast Cancer  Practice breast self-awareness. This means understanding how your breasts normally appear and feel.  It also means doing regular breast self-exams. Let your health care provider know about any changes, no matter how small.  If you are in your 20s or 30s, you should have a clinical breast  exam (CBE) by a health care provider every 1-3 years as part of a regular health exam.  If you are 6 or older, have a CBE every year. Also consider having a breast X-ray (mammogram) every year.  If you have a family history of breast cancer, talk to your health care provider about genetic screening.  If you are at high risk for breast cancer, talk to your health care provider about having an MRI and a mammogram every year.  Breast cancer gene (BRCA) assessment is recommended for women who have family members with BRCA-related cancers.  BRCA-related cancers include:  Breast.  Ovarian.  Tubal.  Peritoneal cancers.  Results of the assessment will determine the need for genetic counseling and BRCA1 and BRCA2 testing. Cervical Cancer  Your health care provider may recommend that you be screened regularly for cancer of the pelvic organs (ovaries, uterus, and vagina). This screening involves a pelvic examination, including checking for microscopic changes to the surface of your cervix (Pap test). You may be encouraged to have this screening done every 3 years, beginning at age 29.  For women ages 39-65, health care providers may recommend pelvic exams and Pap testing every 3 years, or they may recommend the Pap and pelvic exam, combined with testing for human papilloma virus (HPV), every 5 years. Some types of HPV increase your risk of cervical cancer. Testing for HPV may also be done on women of any age with unclear Pap test results.  Other health care providers may not recommend any screening for nonpregnant women who are considered low risk for pelvic cancer and who do not have symptoms. Ask your health care provider if a screening pelvic exam is right for you.  If you have had past treatment for cervical cancer or a condition that could lead to cancer, you need Pap tests and screening for cancer for at least 20 years after your treatment. If Pap tests have been discontinued, your risk factors (such as having a new sexual partner) need to be reassessed to determine if screening should resume. Some women have medical problems that increase the chance of getting cervical cancer. In these cases, your health care provider may recommend more frequent screening and Pap tests. Colorectal Cancer  This type of cancer can be detected and often prevented.  Routine colorectal cancer screening usually begins at 43 years of age and continues through 43 years of age.  Your health care provider may recommend screening at an earlier age if you  have risk factors for colon cancer.  Your health care provider may also recommend using home test kits to check for hidden blood in the stool.  A small camera at the end of a tube can be used to examine your colon directly (sigmoidoscopy or colonoscopy). This is done to check for the earliest forms of colorectal cancer.  Routine screening usually begins at age 74.  Direct examination of the colon should be repeated every 5-10 years through 43 years of age. However, you may need to be screened more often if early forms of precancerous polyps or small growths are found. Skin Cancer  Check your skin from head to toe regularly.  Tell your health care provider about any new moles or changes in moles, especially if there is a change in a mole's shape or color.  Also tell your health care provider if you have a mole that is larger than the size of a pencil eraser.  Always use sunscreen. Apply sunscreen liberally and  repeatedly throughout the day.  Protect yourself by wearing long sleeves, pants, a wide-brimmed hat, and sunglasses whenever you are outside. Heart disease, diabetes, and high blood pressure  High blood pressure causes heart disease and increases the risk of stroke. High blood pressure is more likely to develop in:  People who have blood pressure in the high end of the normal range (130-139/85-89 mm Hg).  People who are overweight or obese.  People who are African American.  If you are 18-39 years of age, have your blood pressure checked every 3-5 years. If you are 40 years of age or older, have your blood pressure checked every year. You should have your blood pressure measured twice-once when you are at a hospital or clinic, and once when you are not at a hospital or clinic. Record the average of the two measurements. To check your blood pressure when you are not at a hospital or clinic, you can use:  An automated blood pressure machine at a pharmacy.  A home blood pressure  monitor.  If you are between 55 years and 79 years old, ask your health care provider if you should take aspirin to prevent strokes.  Have regular diabetes screenings. This involves taking a blood sample to check your fasting blood sugar level.  If you are at a normal weight and have a low risk for diabetes, have this test once every three years after 43 years of age.  If you are overweight and have a high risk for diabetes, consider being tested at a younger age or more often. Preventing infection Hepatitis B  If you have a higher risk for hepatitis B, you should be screened for this virus. You are considered at high risk for hepatitis B if:  You were born in a country where hepatitis B is common. Ask your health care provider which countries are considered high risk.  Your parents were born in a high-risk country, and you have not been immunized against hepatitis B (hepatitis B vaccine).  You have HIV or AIDS.  You use needles to inject street drugs.  You live with someone who has hepatitis B.  You have had sex with someone who has hepatitis B.  You get hemodialysis treatment.  You take certain medicines for conditions, including cancer, organ transplantation, and autoimmune conditions. Hepatitis C  Blood testing is recommended for:  Everyone born from 1945 through 1965.  Anyone with known risk factors for hepatitis C. Sexually transmitted infections (STIs)  You should be screened for sexually transmitted infections (STIs) including gonorrhea and chlamydia if:  You are sexually active and are younger than 43 years of age.  You are older than 43 years of age and your health care provider tells you that you are at risk for this type of infection.  Your sexual activity has changed since you were last screened and you are at an increased risk for chlamydia or gonorrhea. Ask your health care provider if you are at risk.  If you do not have HIV, but are at risk, it may be  recommended that you take a prescription medicine daily to prevent HIV infection. This is called pre-exposure prophylaxis (PrEP). You are considered at risk if:  You are sexually active and do not regularly use condoms or know the HIV status of your partner(s).  You take drugs by injection.  You are sexually active with a partner who has HIV. Talk with your health care provider about whether you are at high risk   of being infected with HIV. If you choose to begin PrEP, you should first be tested for HIV. You should then be tested every 3 months for as long as you are taking PrEP. Pregnancy  If you are premenopausal and you may become pregnant, ask your health care provider about preconception counseling.  If you may become pregnant, take 400 to 800 micrograms (mcg) of folic acid every day.  If you want to prevent pregnancy, talk to your health care provider about birth control (contraception). Osteoporosis and menopause  Osteoporosis is a disease in which the bones lose minerals and strength with aging. This can result in serious bone fractures. Your risk for osteoporosis can be identified using a bone density scan.  If you are 29 years of age or older, or if you are at risk for osteoporosis and fractures, ask your health care provider if you should be screened.  Ask your health care provider whether you should take a calcium or vitamin D supplement to lower your risk for osteoporosis.  Menopause may have certain physical symptoms and risks.  Hormone replacement therapy may reduce some of these symptoms and risks. Talk to your health care provider about whether hormone replacement therapy is right for you. Follow these instructions at home:  Schedule regular health, dental, and eye exams.  Stay current with your immunizations.  Do not use any tobacco products including cigarettes, chewing tobacco, or electronic cigarettes.  If you are pregnant, do not drink alcohol.  If you are  breastfeeding, limit how much and how often you drink alcohol.  Limit alcohol intake to no more than 1 drink per day for nonpregnant women. One drink equals 12 ounces of beer, 5 ounces of wine, or 1 ounces of hard liquor.  Do not use street drugs.  Do not share needles.  Ask your health care provider for help if you need support or information about quitting drugs.  Tell your health care provider if you often feel depressed.  Tell your health care provider if you have ever been abused or do not feel safe at home. This information is not intended to replace advice given to you by your health care provider. Make sure you discuss any questions you have with your health care provider. Document Released: 06/25/2011 Document Revised: 05/17/2016 Document Reviewed: 09/13/2015 Elsevier Interactive Patient Education  2017 [employer].

## 2017-03-14 NOTE — Assessment & Plan Note (Signed)
Managed by Dr. Tamela OddiJo Hughes, stable on Vyvanse.

## 2017-03-14 NOTE — Progress Notes (Addendum)
Subjective:    Mia Hunter is a 41 y.o. female and is here for a comprehensive physical exam.  HPI  Health Maintenance Due  Topic Date Due  . HIV Screening  10/19/1989  . PAP SMEAR  10/24/2016   Acute Concerns: None  Chronic Issues: Arthritis -- both sides of her lower back hurt during menses, has been evaluated by prior PCP, was told that she has inflammation, and that this has improved with time after partial hysterectomy; no issues with urination/bowels, no unintentional weight loss, night sweats or fevers ADD, anxiety and depression - follows up q 6 months, feels very stable with Lamictal and vyvanse; stopped xanax because Lamictal and Vyvanse is working well for her; doesn't have an addictive personality; no thoughts of suicide or homicide Sugar cravings -- nothing satisfies her craving, at least a year she has been dealing with his; she is concerned because her dad has a history of DM IBS/constipation -- was told to stop metamucil by Dr. Leone Payor, has "very slow transit", saw him 1 year ago, only sees him as needed; has about 2 BM's weekly GERD -- well controlled with protonix Fatigue -- has been going on for at least 1 year, is interested in checking her vitamin D and B12 levels as well as thyroid  Health Maintenance: Immunizations -- Tetanus due today Colonoscopy -- no family history, will start at age 1 Mammogram -- up to date, requesting records, managed by Ob-Gyn PAP -- up to date, requesting records, managed by Ob-Gyn Diet --  Breakfast: eats sometimes; chocolate chip cookies or oatmeal and coffee Lunch: eats out -- [employer], [employer], [employer] and french fries; kids meals; water Dinner: mostly during the week is eating at home, cereal, tilapia, baked potatoes, broccoli --> all high fiber  Desserts: chocolate chip cookies, ice cream Caffeine intake -- 2 x 8 oz daily, no energy drinks or soda Sleep habits -- uses Advil PM nightly, psychiatrist tried Trazodone and it  made her really groggy, has not tried melatonin Exercise -- 7 days a week, cardio activity HR will get up to 130 when active Weight -- Weight: 175 lb (79.4 kg) -- normal 172-175 lb for years Mood -- "okay"  Depression screen [facility] 2/9 03/14/2017  Decreased Interest 0  Down, Depressed, Hopeless 0  PHQ - 2 Score 0  Altered sleeping 3  Tired, decreased energy 3  Change in appetite 3  Feeling bad or failure about yourself  1  Trouble concentrating 3  Moving slowly or fidgety/restless 3  Suicidal thoughts 0  PHQ-9 Score 16   Other providers/specialists: GI -- Dr. Leone Payor Ob-Gyn - Eveline Keto Psych - Tamela Oddi   PMHx, SurgHx, SocialHx, Medications, and Allergies were reviewed in the Visit Navigator and updated as appropriate.   Past Medical History:  Diagnosis Date  . 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 Arthritis    lower back  . GERD (gastroesophageal reflux disease)   . HSV infection      Past Surgical History:  Procedure Laterality Date  . ANAL RECTAL MANOMETRY N/A 01/02/2016   Procedure: ANO RECTAL MANOMETRY;  Surgeon: Iva Boop, MD;  Location: [facility];  Service: Endoscopy;  Laterality: N/A;  . BILATERAL SALPINGECTOMY Bilateral 02/28/2015   Procedure: BILATERAL SALPINGECTOMY;  Surgeon: Huel Cote, MD;  Location: [facility];  Service: Gynecology;  Laterality: Bilateral;  . HERNIA REPAIR  10/2010   done by [facility]  . LAPAROSCOPIC ASSISTED VAGINAL HYSTERECTOMY N/A 02/28/2015   Procedure: LAPAROSCOPIC ASSISTED  VAGINAL HYSTERECTOMY;  Surgeon: Huel Cote, MD;  Location: [facility];  Service: Gynecology;  Laterality: N/A;  . TUBAL LIGATION    . WISDOM TOOTH EXTRACTION       Family History  Problem Relation Age of Onset  . Hypercholesterolemia Mother   . Cancer Father     lump on the side of the neck; radiation and chemo; none since  . Diabetes Father   . Hypercholesterolemia Father   . Breast cancer Paternal Aunt   . Breast cancer Cousin     paternal    . Breast cancer Cousin     maternal  . Diabetes Paternal Grandmother     Social History  Substance Use Topics  . Smoking status: Former Smoker    Packs/day: 0.50    Years: 15.00    Types: Cigarettes    Quit date: 09/23/2010  . Smokeless tobacco: Never Used  . Alcohol use 0.0 oz/week     Comment: occasional    Review of Systems:   Review of Systems  Constitutional: Positive for malaise/fatigue.  HENT: Negative.   Eyes: Negative.   Respiratory: Negative.   Cardiovascular: Negative.   Gastrointestinal: Positive for constipation and heartburn.  Genitourinary: Negative.   Musculoskeletal: Negative.   Skin: Negative.   Neurological: Negative.   Endo/Heme/Allergies: Negative.   Psychiatric/Behavioral: Negative.     Objective:   BP 130/86 (BP Location: Left Arm, Patient Position: Sitting, Cuff Size: Normal)   Pulse 69   Temp 98.7 F (37.1 C) (Oral)   Ht 5\' 5"  (1.651 m)   Wt 175 lb (79.4 kg)   LMP 02/15/2015 (Exact Date)   SpO2 99%   BMI 29.12 kg/m  Body mass index is 29.12 kg/m.   General Appearance:    Alert, cooperative, no distress, appears stated age  Head:    Normocephalic, without obvious abnormality, atraumatic  Eyes:    PERRL, conjunctiva/corneas clear, EOM's intact, fundi    benign, both eyes  Ears:    Normal TM's and external ear canals, both ears  Nose:   Nares normal, septum midline, mucosa normal, no drainage    or sinus tenderness  Throat:   Lips, mucosa, and tongue normal; teeth and gums normal  Neck:   Supple, symmetrical, trachea midline, no adenopathy;    thyroid:  no enlargement/tenderness/nodules; no carotid   bruit or JVD  Back:     Symmetric, no curvature, ROM normal, no CVA tenderness  Lungs:     Clear to auscultation bilaterally, respirations unlabored  Chest Wall:    No tenderness or deformity   Heart:    Regular rate and rhythm, S1 and S2 normal, no murmur, rub   or gallop  Breast Exam:    Deferred -- sees ob-gyn  Abdomen:     Soft,  non-tender, bowel sounds active all four quadrants,    no masses, no organomegaly  Genitalia:    Deferred -- sees ob-gyn  Extremities:   Extremities normal, atraumatic, no cyanosis or edema  Pulses:   2+ and symmetric all extremities  Skin:   Skin color, texture, turgor normal, no rashes or lesions  Lymph nodes:   Cervical, supraclavicular, and axillary nodes normal  Neurologic:   CNII-XII intact, normal strength, sensation and reflexes    throughout    Assessment/Plan:    Problem List Items Addressed This Visit      Digestive   Constipation    Sees Dr. Stan Head when necessary. She takes senna and lactulose as needed. This  is a chronic issue for patient      GERD (gastroesophageal reflux disease)    Well-controlled with Protonix.        Other   Depression, major, single episode, mild (HCC)    Managed by Dr. Tamela OddiJo Hughes, stable on lamictal.      ADD (attention deficit disorder)    Managed by Dr. Tamela OddiJo Hughes, stable on Vyvanse.      Anxiety    Managed by Dr. Tamela OddiJo Hughes, stable on Lamictal.       Other Visit Diagnoses    Encounter for general adult medical examination with abnormal findings    -  Primary Today patient counseled on age appropriate routine health concerns for screening and prevention, each reviewed and up to date or declined. Immunizations reviewed and up to date or declined. Labs ordered and reviewed. Risk factors for depression reviewed and negative. Hearing function and visual acuity are intact. ADLs screened and addressed as needed. Functional ability and level of safety reviewed and appropriate. Education, counseling and referrals performed based on assessed risks today. Patient provided with a copy of personalized plan for preventive services. She is agreeable to once in a lifetime HIV screening.   Relevant Orders   CBC with Differential/Platelet   Comprehensive metabolic panel   HIV antibody   Lipid panel   TSH   T4, free   Need for prophylactic  vaccination with combined diphtheria-tetanus-pertussis (DTP) vaccine       Relevant Orders   Tdap vaccine greater than or equal to 7yo IM (Completed)   Fatigue, unspecified type    Labs today. Work on sleep. Discussed using Melatonin to help with sleep, f/u with Dr. Tamela OddiJo Hughes for management of sleep medications if indicated.    Relevant Orders   VITAMIN D 25 Hydroxy (Vit-D Deficiency, Fractures)   Vitamin B12   Abnormal craving  Persistently craves sweets. Will order routine labs.           Well Adult Exam: Labs ordered: Yes. Patient counseling was done. See below for items discussed. Discussed the patient's BMI.  The BMI BMI is not in the acceptable range; BMI management plan is completed Follow up as needed for acute illness. Breast cancer screening: completed by ob-gyn. Cervical cancer screening: completed by ob-gyn.  Patient Counseling: [x]    Nutrition: Stressed importance of moderation in sodium/caffeine intake, saturated fat and cholesterol, caloric balance, sufficient intake of fresh fruits, vegetables, fiber, calcium, iron, and 1 mg of folate supplement per day (for females capable of pregnancy).  [x]    Stressed the importance of regular exercise.   [x]    Substance Abuse: Discussed cessation/primary prevention of tobacco, alcohol, or other drug use; driving or other dangerous activities under the influence; availability of treatment for abuse.   [x]    Injury prevention: Discussed safety belts, safety helmets, smoke detector, smoking near bedding or upholstery.   [x]    Sexuality: Discussed sexually transmitted diseases, partner selection, use of condoms, avoidance of unintended pregnancy  and contraceptive alternatives.  [x]    Dental health: Discussed importance of regular tooth brushing, flossing, and dental visits.  [x]    Health maintenance and immunizations reviewed. Please refer to Health maintenance section.   Jarold MottoSamantha Worley, PA-C [facility]

## 2017-03-15 ENCOUNTER — Other Ambulatory Visit (INDEPENDENT_AMBULATORY_CARE_PROVIDER_SITE_OTHER): Payer: BLUE CROSS/BLUE SHIELD

## 2017-03-15 DIAGNOSIS — Z0001 Encounter for general adult medical examination with abnormal findings: Secondary | ICD-10-CM

## 2017-03-15 DIAGNOSIS — R5383 Other fatigue: Secondary | ICD-10-CM

## 2017-03-15 LAB — COMPREHENSIVE METABOLIC PANEL
ALT: 10 U/L (ref 0–35)
AST: 12 U/L (ref 0–37)
Albumin: 4.2 g/dL (ref 3.5–5.2)
Alkaline Phosphatase: 33 U/L — ABNORMAL LOW (ref 39–117)
BUN: 10 mg/dL (ref 6–23)
CALCIUM: 9 mg/dL (ref 8.4–10.5)
CO2: 28 mEq/L (ref 19–32)
CREATININE: 0.79 mg/dL (ref 0.40–1.20)
Chloride: 105 mEq/L (ref 96–112)
GFR: 84.66 mL/min (ref >60.00)
Glucose, Bld: 87 mg/dL (ref 70–99)
POTASSIUM: 4 meq/L (ref 3.5–5.1)
SODIUM: 138 meq/L (ref 135–145)
TOTAL PROTEIN: 6.4 g/dL (ref 6.0–8.3)
Total Bilirubin: 0.6 mg/dL (ref 0.2–1.2)

## 2017-03-15 LAB — LIPID PANEL
Cholesterol: 140 mg/dL (ref 0–200)
HDL: 53.5 mg/dL (ref >39.00)
LDL Cholesterol: 76 mg/dL (ref 0–99)
NonHDL: 86.86
TRIGLYCERIDES: 52 mg/dL (ref 0.0–149.0)
Total CHOL/HDL Ratio: 3
VLDL: 10.4 mg/dL (ref 0.0–40.0)

## 2017-03-15 LAB — CBC WITH DIFFERENTIAL/PLATELET
BASOS PCT: 0.7 % (ref 0.0–3.0)
Basophils Absolute: 0 10*3/uL (ref 0.0–0.1)
Eosinophils Absolute: 0 10*3/uL (ref 0.0–0.7)
Eosinophils Relative: 0 % (ref 0.0–5.0)
HCT: 40.5 % (ref 36.0–46.0)
HEMOGLOBIN: 13.6 g/dL (ref 12.0–15.0)
LYMPHS PCT: 19.5 % (ref 12.0–46.0)
Lymphs Abs: 1.3 10*3/uL (ref 0.7–4.0)
MCHC: 33.6 g/dL (ref 30.0–36.0)
MCV: 90.8 fl (ref 78.0–100.0)
MONO ABS: 0.4 10*3/uL (ref 0.1–1.0)
MONOS PCT: 6.7 % (ref 3.0–12.0)
Neutro Abs: 4.7 10*3/uL (ref 1.4–7.7)
Neutrophils Relative %: 73.1 % (ref 43.0–77.0)
Platelets: 297 10*3/uL (ref 150.0–400.0)
RBC: 4.46 Mil/uL (ref 3.87–5.11)
RDW: 12.8 % (ref 11.5–15.5)
WBC: 6.4 10*3/uL (ref 4.0–10.5)

## 2017-03-15 LAB — T4, FREE: FREE T4: 0.72 ng/dL (ref 0.60–1.60)

## 2017-03-15 LAB — VITAMIN D 25 HYDROXY (VIT D DEFICIENCY, FRACTURES): VITD: 35.78 ng/mL (ref 30.00–100.00)

## 2017-03-15 LAB — TSH: TSH: 2.22 u[IU]/mL (ref 0.35–4.50)

## 2017-03-16 LAB — VITAMIN B12: Vitamin B-12: 246 pg/mL (ref 200–1100)

## 2017-03-17 LAB — HIV ANTIBODY (ROUTINE TESTING W REFLEX): HIV 1&2 Ab, 4th Generation: NONREACTIVE

## 2017-03-18 ENCOUNTER — Encounter: Payer: Self-pay | Admitting: Internal Medicine

## 2017-06-05 DIAGNOSIS — F909 Attention-deficit hyperactivity disorder, unspecified type: Secondary | ICD-10-CM | POA: Diagnosis not present

## 2017-06-05 DIAGNOSIS — F39 Unspecified mood [affective] disorder: Secondary | ICD-10-CM | POA: Diagnosis not present

## 2017-06-07 IMAGING — DX DG ABDOMEN 1V
1 series · 1 of 1 positions shown · non-contrast
Comparison: None.

CLINICAL DATA: Sitz markers

EXAM:
ABDOMEN - 1 VIEW

[abdomen kub]
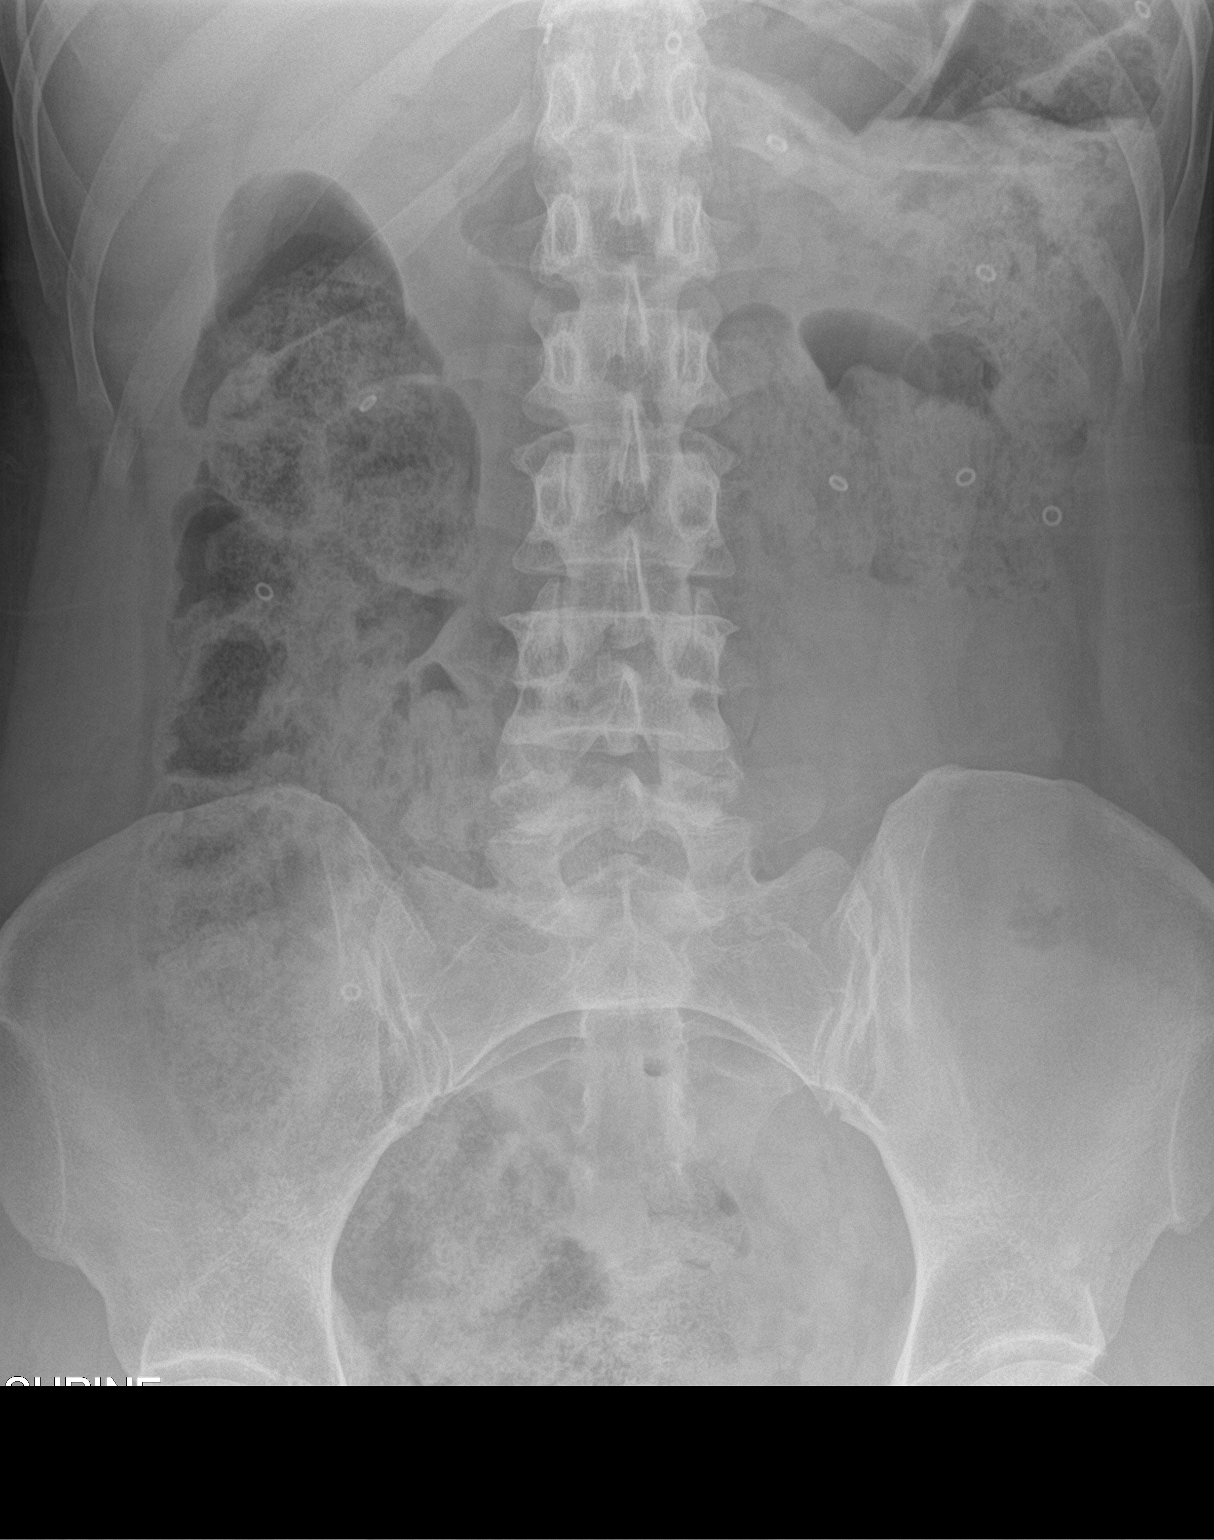

[1 of 1 positions shown; findings below may reference images not displayed]

FINDINGS: Ten Sitz markers are seen from the proximal colon to the splenic
flexure. Large volume stool retention. No evidence of bowel
obstruction. No concerning intra-abdominal mass effect or
calcification.
IMPRESSION: Ten retained Sitz markers.

Large volume stool retention.

## 2017-10-01 DIAGNOSIS — Z6829 Body mass index (BMI) 29.0-29.9, adult: Secondary | ICD-10-CM | POA: Diagnosis not present

## 2017-10-01 DIAGNOSIS — Z01419 Encounter for gynecological examination (general) (routine) without abnormal findings: Secondary | ICD-10-CM | POA: Diagnosis not present

## 2017-10-01 DIAGNOSIS — Z1389 Encounter for screening for other disorder: Secondary | ICD-10-CM | POA: Diagnosis not present

## 2017-10-01 DIAGNOSIS — Z130 Encounter for screening for diseases of the blood and blood-forming organs and certain disorders involving the immune mechanism: Secondary | ICD-10-CM | POA: Diagnosis not present

## 2017-10-07 DIAGNOSIS — Z1231 Encounter for screening mammogram for malignant neoplasm of breast: Secondary | ICD-10-CM | POA: Diagnosis not present

## 2017-10-27 ENCOUNTER — Other Ambulatory Visit: Payer: Self-pay | Admitting: Internal Medicine

## 2017-10-28 NOTE — Telephone Encounter (Signed)
May I refill Sir, thank you. 

## 2017-10-29 NOTE — Telephone Encounter (Signed)
OK to refill x 1 year 

## 2017-12-09 DIAGNOSIS — F909 Attention-deficit hyperactivity disorder, unspecified type: Secondary | ICD-10-CM | POA: Diagnosis not present

## 2017-12-09 DIAGNOSIS — F39 Unspecified mood [affective] disorder: Secondary | ICD-10-CM | POA: Diagnosis not present

## 2018-01-20 DIAGNOSIS — F39 Unspecified mood [affective] disorder: Secondary | ICD-10-CM | POA: Diagnosis not present

## 2018-01-20 DIAGNOSIS — F909 Attention-deficit hyperactivity disorder, unspecified type: Secondary | ICD-10-CM | POA: Diagnosis not present

## 2018-05-08 DIAGNOSIS — F909 Attention-deficit hyperactivity disorder, unspecified type: Secondary | ICD-10-CM | POA: Diagnosis not present

## 2018-05-08 DIAGNOSIS — F3161 Bipolar disorder, current episode mixed, mild: Secondary | ICD-10-CM | POA: Diagnosis not present

## 2018-06-05 DIAGNOSIS — F3161 Bipolar disorder, current episode mixed, mild: Secondary | ICD-10-CM | POA: Diagnosis not present

## 2018-06-05 DIAGNOSIS — F909 Attention-deficit hyperactivity disorder, unspecified type: Secondary | ICD-10-CM | POA: Diagnosis not present

## 2018-07-15 DIAGNOSIS — R6882 Decreased libido: Secondary | ICD-10-CM | POA: Diagnosis not present

## 2018-07-16 ENCOUNTER — Encounter: Payer: Self-pay | Admitting: *Deleted

## 2018-09-02 ENCOUNTER — Encounter: Payer: Self-pay | Admitting: Physician Assistant

## 2018-09-03 DIAGNOSIS — F909 Attention-deficit hyperactivity disorder, unspecified type: Secondary | ICD-10-CM | POA: Diagnosis not present

## 2018-09-03 DIAGNOSIS — F3161 Bipolar disorder, current episode mixed, mild: Secondary | ICD-10-CM | POA: Diagnosis not present

## 2018-09-11 ENCOUNTER — Encounter: Payer: BLUE CROSS/BLUE SHIELD | Admitting: Physician Assistant

## 2018-09-17 ENCOUNTER — Encounter: Payer: Self-pay | Admitting: Physician Assistant

## 2018-09-17 ENCOUNTER — Ambulatory Visit (INDEPENDENT_AMBULATORY_CARE_PROVIDER_SITE_OTHER): Payer: BLUE CROSS/BLUE SHIELD | Admitting: Physician Assistant

## 2018-09-17 VITALS — BP 120/82 | HR 65 | Temp 98.7°F | Ht 65.5 in | Wt 180.4 lb

## 2018-09-17 DIAGNOSIS — F320 Major depressive disorder, single episode, mild: Secondary | ICD-10-CM

## 2018-09-17 DIAGNOSIS — R638 Other symptoms and signs concerning food and fluid intake: Secondary | ICD-10-CM | POA: Diagnosis not present

## 2018-09-17 DIAGNOSIS — F988 Other specified behavioral and emotional disorders with onset usually occurring in childhood and adolescence: Secondary | ICD-10-CM

## 2018-09-17 DIAGNOSIS — K5901 Slow transit constipation: Secondary | ICD-10-CM

## 2018-09-17 DIAGNOSIS — K219 Gastro-esophageal reflux disease without esophagitis: Secondary | ICD-10-CM | POA: Diagnosis not present

## 2018-09-17 DIAGNOSIS — F419 Anxiety disorder, unspecified: Secondary | ICD-10-CM

## 2018-09-17 DIAGNOSIS — Z136 Encounter for screening for cardiovascular disorders: Secondary | ICD-10-CM

## 2018-09-17 DIAGNOSIS — Z0001 Encounter for general adult medical examination with abnormal findings: Secondary | ICD-10-CM

## 2018-09-17 DIAGNOSIS — A6000 Herpesviral infection of urogenital system, unspecified: Secondary | ICD-10-CM | POA: Insufficient documentation

## 2018-09-17 DIAGNOSIS — Z13220 Encounter for screening for lipoid disorders: Secondary | ICD-10-CM

## 2018-09-17 DIAGNOSIS — R6882 Decreased libido: Secondary | ICD-10-CM

## 2018-09-17 MED ORDER — PANTOPRAZOLE SODIUM 40 MG PO TBEC: DELAYED_RELEASE_TABLET | ORAL | 0 refills | Status: DC

## 2018-09-17 NOTE — Progress Notes (Signed)
I acted as a Neurosurgeon for [employer], PA-C Corky Mull, LPN   Subjective:    Mia Hunter is a 42 y.o. female and is here for a comprehensive physical exam.   HPI  There are no preventive care reminders to display for this patient.  Acute Concerns: Low libido -- went to her ob-gyn and was prescribed a cream. She hasn't used it. She is interested in seeing another ob-gyn for a second opinion. Sugar cravings -- discussed at last visit. Continues to crave sugar regularly. Had cookies for breakfast this AM, but only 5 lb weight gain x 1 year.  Chronic Issues: MDD and anxiety -- currently on Lamictal, Effexor, Vyvanse; having breakthrough anxiety; last seen a therapist several years ago and thinks maybe they didn't mesh well because "she was agreeing to everything" she said. She is agreeable to seeing someone else. IBS -- dulcolax daily, lactulose prn; was told to stay away from fiber because this causes worsening symptoms, has been on Amitiza in the past without improvement, sees Dr. Leone Payor as needed GERD -- protonix works well for her, needs refill  Health Maintenance: Immunizations -- UTD Colonoscopy -- N/A Mammogram -- due, scheduled for Oct wit GYN PAP -- N/A, Hysterectomy, 02/28/2015 Bone Density -- N/A Diet -- increased protein and has tried some supplements here and there; drinks lots of low sugar products Sleep habits -- fair Exercise -- goes to gym regularly Weight -- Weight: 180 lb 6.1 oz (81.8 kg)  Weight history: Wt Readings from Last 10 Encounters:  09/17/18 180 lb 6.1 oz (81.8 kg)  03/14/17 175 lb (79.4 kg)  03/19/16 171 lb 4 oz (77.7 kg)  11/15/15 175 lb (79.4 kg)  08/31/15 170 lb 12.8 oz (77.5 kg)  02/28/15 170 lb (77.1 kg)  02/16/15 170 lb (77.1 kg)  12/31/14 171 lb 2 oz (77.6 kg)  01/27/14 181 lb 9.6 oz (82.4 kg)  07/22/13 175 lb (79.4 kg)   Patient's last menstrual period was 02/15/2015 (exact date). Period characteristics: irregular Alcohol  use: limited Tobacco use: former smoker  Depression screen PHQ 2/9 09/17/2018  Decreased Interest 2  Down, Depressed, Hopeless 0  PHQ - 2 Score 2  Altered sleeping 3  Tired, decreased energy 3  Change in appetite 3  Feeling bad or failure about yourself  1  Trouble concentrating 3  Moving slowly or fidgety/restless 3  Suicidal thoughts 0  PHQ-9 Score 18  Difficult doing work/chores Very difficult     Other providers/specialists: Leone Payor -- GI Eveline Keto -- ob-gyn Tamela Oddi -- psych   PMHx, SurgHx, SocialHx, Medications, and Allergies were reviewed in the Visit Navigator and updated as appropriate.   Past Medical History:  Diagnosis Date  . 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 Arthritis    lower back  . GERD (gastroesophageal reflux disease)   . HSV infection      Past Surgical History:  Procedure Laterality Date  . ANAL RECTAL MANOMETRY N/A 01/02/2016   Procedure: ANO RECTAL MANOMETRY;  Surgeon: Iva Boop, MD;  Location: [facility];  Service: Endoscopy;  Laterality: N/A;  . BILATERAL SALPINGECTOMY Bilateral 02/28/2015   Procedure: BILATERAL SALPINGECTOMY;  Surgeon: Huel Cote, MD;  Location: [facility];  Service: Gynecology;  Laterality: Bilateral;  . HERNIA REPAIR Right 10/2010   done by [facility]  . LAPAROSCOPIC ASSISTED VAGINAL HYSTERECTOMY N/A 02/28/2015   Procedure: LAPAROSCOPIC ASSISTED VAGINAL HYSTERECTOMY;  Surgeon: Huel Cote, MD;  Location: [facility];  Service: Gynecology;  Laterality:  N/A;  . TUBAL LIGATION    . WISDOM TOOTH EXTRACTION       Family History  Problem Relation Age of Onset  . Hypercholesterolemia Mother   . Cancer Father        lump on the side of the neck; radiation and chemo; none since  . Diabetes Father   . Hypercholesterolemia Father   . Breast cancer Paternal Aunt   . Breast cancer Cousin        paternal  . Breast cancer Cousin        maternal  . Diabetes Paternal Grandmother     Social History   Tobacco  Use  . Smoking status: Former Smoker    Packs/day: 0.50    Years: 15.00    Pack years: 7.50    Types: Cigarettes    Last attempt to quit: 09/23/2010    Years since quitting: 7.9  . Smokeless tobacco: Never Used  Substance Use Topics  . Alcohol use: Yes    Alcohol/week: 0.0 standard drinks    Comment: occasional  . Drug use: No    Review of Systems:   Review of Systems  Constitutional: Negative for chills, fever, malaise/fatigue and weight loss.  HENT: Negative for hearing loss, sinus pain and sore throat.   Respiratory: Negative for cough and hemoptysis.   Cardiovascular: Negative for chest pain, palpitations, leg swelling and PND.  Gastrointestinal: Positive for constipation. Negative for abdominal pain, diarrhea, heartburn, nausea and vomiting.  Genitourinary: Negative for dysuria, frequency and urgency.  Musculoskeletal: Negative for back pain, myalgias and neck pain.  Skin: Negative for itching and rash.  Neurological: Negative for dizziness, tingling, seizures and headaches.  Endo/Heme/Allergies: Negative for polydipsia.  Psychiatric/Behavioral: Negative for depression. The patient is not nervous/anxious.     Objective:   BP 120/82 (BP Location: Left Arm, Patient Position: Sitting, Cuff Size: Normal)   Pulse 65   Temp 98.7 F (37.1 C) (Oral)   Ht 5' 5.5" (1.664 m)   Wt 180 lb 6.1 oz (81.8 kg)   LMP 02/15/2015 (Exact Date)   SpO2 99%   BMI 29.56 kg/m  Body mass index is 29.56 kg/m.   General Appearance:    Alert, cooperative, no distress, appears stated age  Head:    Normocephalic, without obvious abnormality, atraumatic  Eyes:    PERRL, conjunctiva/corneas clear, EOM's intact, fundi    benign, both eyes  Ears:    Normal TM's and external ear canals, both ears  Nose:   Nares normal, septum midline, mucosa normal, no drainage    or sinus tenderness  Throat:   Lips, mucosa, and tongue normal; teeth and gums normal  Neck:   Supple, symmetrical, trachea midline,  no adenopathy;    thyroid:  no enlargement/tenderness/nodules; no carotid   bruit or JVD  Back:     Symmetric, no curvature, ROM normal, no CVA tenderness  Lungs:     Clear to auscultation bilaterally, respirations unlabored  Chest Wall:    No tenderness or deformity   Heart:    Regular rate and rhythm, S1 and S2 normal, no murmur, rub   or gallop  Breast Exam:    Deferred  Abdomen:     Soft, non-tender, bowel sounds active all four quadrants,    no masses, no organomegaly  Genitalia:    Deferred  Extremities:   Extremities normal, atraumatic, no cyanosis or edema  Pulses:   2+ and symmetric all extremities  Skin:   Skin color, texture, turgor  normal, no rashes or lesions  Lymph nodes:   Cervical, supraclavicular, and axillary nodes normal  Neurologic:   CNII-XII intact, normal strength, sensation and reflexes    throughout    Assessment/Plan:   Mia Hunter was seen today for annual exam.  Diagnoses and all orders for this visit:  Encounter for general adult medical examination with abnormal findings Today patient counseled on age appropriate routine health concerns for screening and prevention, each reviewed and up to date or declined. Immunizations reviewed and up to date or declined. Labs ordered and reviewed. Risk factors for depression reviewed and negative. Hearing function and visual acuity are intact. ADLs screened and addressed as needed. Functional ability and level of safety reviewed and appropriate. Education, counseling and referrals performed based on assessed risks today. Patient provided with a copy of personalized plan for preventive services.  Slow transit constipation Management per Dr. Leone Payor. -     CBC with Differential/Platelet; Future -     Comprehensive metabolic panel; Future  Attention deficit disorder, unspecified hyperactivity presence; Depression, major, single episode, mild (HCC); Anxiety Management per psych. No SI/HI. Long discussion regarding her mental  health, she would like to try to meet with Colen Darling here in our office. I agree with this plan.  Abnormal craving Check free insulin. Consider starting Metformin based on lab results. -     Insulin, Free (Bioactive)-([facility]); Future  Gastroesophageal reflux disease, esophagitis presence not specified Refill protonix.  Encounter for lipid screening for cardiovascular disease -     Lipid panel; Future  Low libido She is planning to find a new ob-gyn to discuss further.  Other orders -     pantoprazole (PROTONIX) 40 MG tablet; TAKE 1 TABLET (40 MG TOTAL) BY MOUTH DAILY.     Well Adult Exam: Labs ordered: Yes. Patient counseling was done. See below for items discussed. Discussed the patient's BMI.  The BMI BMI is not in the acceptable range; BMI management plan is completed Follow up as needed for acute illness. Breast cancer screening: next month. Cervical cancer screening: n/a   Patient Counseling: [x]    Nutrition: Stressed importance of moderation in sodium/caffeine intake, saturated fat and cholesterol, caloric balance, sufficient intake of fresh fruits, vegetables, fiber, calcium, iron, and 1 mg of folate supplement per day (for females capable of pregnancy).  [x]    Stressed the importance of regular exercise.   [x]    Substance Abuse: Discussed cessation/primary prevention of tobacco, alcohol, or other drug use; driving or other dangerous activities under the influence; availability of treatment for abuse.   [x]    Injury prevention: Discussed safety belts, safety helmets, smoke detector, smoking near bedding or upholstery.   [x]    Sexuality: Discussed sexually transmitted diseases, partner selection, use of condoms, avoidance of unintended pregnancy  and contraceptive alternatives.  [x]    Dental health: Discussed importance of regular tooth brushing, flossing, and dental visits.  [x]    Health maintenance and immunizations reviewed. Please refer to Health maintenance section.   CMA  or LPN served as scribe during this visit. History, Physical, and Plan performed by medical provider. The above documentation has been reviewed and is accurate and complete.   Jarold Motto, PA-C [facility]

## 2018-09-17 NOTE — Patient Instructions (Signed)
It was great to see you!  Please make an appointment with the lab on your way out. I would like for you to return for lab work within 1-2 weeks. After midnight on the day of the lab draw, please do not eat anything. You may have water, black coffee, unsweetened tea.  I will be in touch regarding adding possible Metformin to help with your sugar cravings after your labs return.  Please make an appointment to meet with Mia Hunter, our in office therapist.  Our office will call you with your results unless you have chosen to receive results via [place].  If your blood work is normal we will follow-up each year for physicals and as scheduled for chronic medical problems.  If anything is abnormal we will treat accordingly and get you in for a follow-up.  Take care,  [facility] Maintenance, Female Adopting a healthy lifestyle and getting preventive care can go a long way to promote health and wellness. Talk with your health care provider about what schedule of regular examinations is right for you. This is a good chance for you to check in with your provider about disease prevention and staying healthy. In between checkups, there are plenty of things you can do on your own. Experts have done a lot of research about which lifestyle changes and preventive measures are most likely to keep you healthy. Ask your health care provider for more information. Weight and diet Eat a healthy diet  Be sure to include plenty of vegetables, fruits, low-fat dairy products, and lean protein.  Do not eat a lot of foods high in solid fats, added sugars, or salt.  Get regular exercise. This is one of the most important things you can do for your health. ? Most adults should exercise for at least 150 minutes each week. The exercise should increase your heart rate and make you sweat (moderate-intensity exercise). ? Most adults should also do strengthening exercises at least twice a week. This is in addition  to the moderate-intensity exercise.  Maintain a healthy weight  Body mass index (BMI) is a measurement that can be used to identify possible weight problems. It estimates body fat based on height and weight. Your health care provider can help determine your BMI and help you achieve or maintain a healthy weight.  For females 45 years of age and older: ? A BMI below 18.5 is considered underweight. ? A BMI of 18.5 to 24.9 is normal. ? A BMI of 25 to 29.9 is considered overweight. ? A BMI of 30 and above is considered obese.  Watch levels of cholesterol and blood lipids  You should start having your blood tested for lipids and cholesterol at 44 years of age, then have this test every 5 years.  You may need to have your cholesterol levels checked more often if: ? Your lipid or cholesterol levels are high. ? You are older than 44 years of age. ? You are at high risk for heart disease.  Cancer screening Lung Cancer  Lung cancer screening is recommended for adults 66-31 years old who are at high risk for lung cancer because of a history of smoking.  A yearly low-dose CT scan of the lungs is recommended for people who: ? Currently smoke. ? Have quit within the past 15 years. ? Have at least a 30-pack-year history of smoking. A pack year is smoking an average of one pack of cigarettes a day for 1 year.  Yearly screening  should continue until it has been 15 years since you quit.  Yearly screening should stop if you develop a health problem that would prevent you from having lung cancer treatment.  Breast Cancer  Practice breast self-awareness. This means understanding how your breasts normally appear and feel.  It also means doing regular breast self-exams. Let your health care provider know about any changes, no matter how small.  If you are in your 20s or 30s, you should have a clinical breast exam (CBE) by a health care provider every 1-3 years as part of a regular health exam.  If  you are 34 or older, have a CBE every year. Also consider having a breast X-ray (mammogram) every year.  If you have a family history of breast cancer, talk to your health care provider about genetic screening.  If you are at high risk for breast cancer, talk to your health care provider about having an MRI and a mammogram every year.  Breast cancer gene (BRCA) assessment is recommended for women who have family members with BRCA-related cancers. BRCA-related cancers include: ? Breast. ? Ovarian. ? Tubal. ? Peritoneal cancers.  Results of the assessment will determine the need for genetic counseling and BRCA1 and BRCA2 testing.  Cervical Cancer Your health care provider may recommend that you be screened regularly for cancer of the pelvic organs (ovaries, uterus, and vagina). This screening involves a pelvic examination, including checking for microscopic changes to the surface of your cervix (Pap test). You may be encouraged to have this screening done every 3 years, beginning at age 42.  For women ages 2-65, health care providers may recommend pelvic exams and Pap testing every 3 years, or they may recommend the Pap and pelvic exam, combined with testing for human papilloma virus (HPV), every 5 years. Some types of HPV increase your risk of cervical cancer. Testing for HPV may also be done on women of any age with unclear Pap test results.  Other health care providers may not recommend any screening for nonpregnant women who are considered low risk for pelvic cancer and who do not have symptoms. Ask your health care provider if a screening pelvic exam is right for you.  If you have had past treatment for cervical cancer or a condition that could lead to cancer, you need Pap tests and screening for cancer for at least 20 years after your treatment. If Pap tests have been discontinued, your risk factors (such as having a new sexual partner) need to be reassessed to determine if screening should  resume. Some women have medical problems that increase the chance of getting cervical cancer. In these cases, your health care provider may recommend more frequent screening and Pap tests.  Colorectal Cancer  This type of cancer can be detected and often prevented.  Routine colorectal cancer screening usually begins at 45 years of age and continues through 44 years of age.  Your health care provider may recommend screening at an earlier age if you have risk factors for colon cancer.  Your health care provider may also recommend using home test kits to check for hidden blood in the stool.  A small camera at the end of a tube can be used to examine your colon directly (sigmoidoscopy or colonoscopy). This is done to check for the earliest forms of colorectal cancer.  Routine screening usually begins at age 70.  Direct examination of the colon should be repeated every 5-10 years through 44 years of age. However, you  may need to be screened more often if early forms of precancerous polyps or small growths are found.  Skin Cancer  Check your skin from head to toe regularly.  Tell your health care provider about any new moles or changes in moles, especially if there is a change in a mole's shape or color.  Also tell your health care provider if you have a mole that is larger than the size of a pencil eraser.  Always use sunscreen. Apply sunscreen liberally and repeatedly throughout the day.  Protect yourself by wearing long sleeves, pants, a wide-brimmed hat, and sunglasses whenever you are outside.  Heart disease, diabetes, and high blood pressure  High blood pressure causes heart disease and increases the risk of stroke. High blood pressure is more likely to develop in: ? People who have blood pressure in the high end of the normal range (130-139/85-89 mm Hg). ? People who are overweight or obese. ? People who are African American.  If you are 90-59 years of age, have your blood  pressure checked every 3-5 years. If you are 15 years of age or older, have your blood pressure checked every year. You should have your blood pressure measured twice-once when you are at a hospital or clinic, and once when you are not at a hospital or clinic. Record the average of the two measurements. To check your blood pressure when you are not at a hospital or clinic, you can use: ? An automated blood pressure machine at a pharmacy. ? A home blood pressure monitor.  If you are between 52 years and 33 years old, ask your health care provider if you should take aspirin to prevent strokes.  Have regular diabetes screenings. This involves taking a blood sample to check your fasting blood sugar level. ? If you are at a normal weight and have a low risk for diabetes, have this test once every three years after 44 years of age. ? If you are overweight and have a high risk for diabetes, consider being tested at a younger age or more often. Preventing infection Hepatitis B  If you have a higher risk for hepatitis B, you should be screened for this virus. You are considered at high risk for hepatitis B if: ? You were born in a country where hepatitis B is common. Ask your health care provider which countries are considered high risk. ? Your parents were born in a high-risk country, and you have not been immunized against hepatitis B (hepatitis B vaccine). ? You have HIV or AIDS. ? You use needles to inject street drugs. ? You live with someone who has hepatitis B. ? You have had sex with someone who has hepatitis B. ? You get hemodialysis treatment. ? You take certain medicines for conditions, including cancer, organ transplantation, and autoimmune conditions.  Hepatitis C  Blood testing is recommended for: ? Everyone born from [birth year] through 1967. ? Anyone with known risk factors for hepatitis C.  Sexually transmitted infections (STIs)  You should be screened for sexually transmitted  infections (STIs) including gonorrhea and chlamydia if: ? You are sexually active and are younger than 44 years of age. ? You are older than 44 years of age and your health care provider tells you that you are at risk for this type of infection. ? Your sexual activity has changed since you were last screened and you are at an increased risk for chlamydia or gonorrhea. Ask your health care provider if you are  at risk.  If you do not have HIV, but are at risk, it may be recommended that you take a prescription medicine daily to prevent HIV infection. This is called pre-exposure prophylaxis (PrEP). You are considered at risk if: ? You are sexually active and do not regularly use condoms or know the HIV status of your partner(s). ? You take drugs by injection. ? You are sexually active with a partner who has HIV.  Talk with your health care provider about whether you are at high risk of being infected with HIV. If you choose to begin PrEP, you should first be tested for HIV. You should then be tested every 3 months for as long as you are taking PrEP. Pregnancy  If you are premenopausal and you may become pregnant, ask your health care provider about preconception counseling.  If you may become pregnant, take 400 to 800 micrograms (mcg) of folic acid every day.  If you want to prevent pregnancy, talk to your health care provider about birth control (contraception). Osteoporosis and menopause  Osteoporosis is a disease in which the bones lose minerals and strength with aging. This can result in serious bone fractures. Your risk for osteoporosis can be identified using a bone density scan.  If you are 11 years of age or older, or if you are at risk for osteoporosis and fractures, ask your health care provider if you should be screened.  Ask your health care provider whether you should take a calcium or vitamin D supplement to lower your risk for osteoporosis.  Menopause may have certain physical  symptoms and risks.  Hormone replacement therapy may reduce some of these symptoms and risks. Talk to your health care provider about whether hormone replacement therapy is right for you. Follow these instructions at home:  Schedule regular health, dental, and eye exams.  Stay current with your immunizations.  Do not use any tobacco products including cigarettes, chewing tobacco, or electronic cigarettes.  If you are pregnant, do not drink alcohol.  If you are breastfeeding, limit how much and how often you drink alcohol.  Limit alcohol intake to no more than 1 drink per day for nonpregnant women. One drink equals 12 ounces of beer, 5 ounces of wine, or 1 ounces of hard liquor.  Do not use street drugs.  Do not share needles.  Ask your health care provider for help if you need support or information about quitting drugs.  Tell your health care provider if you often feel depressed.  Tell your health care provider if you have ever been abused or do not feel safe at home. This information is not intended to replace advice given to you by your health care provider. Make sure you discuss any questions you have with your health care provider. Document Released: 06/25/2011 Document Revised: 05/17/2016 Document Reviewed: 09/13/2015 Elsevier Interactive Patient Education  [employer].

## 2018-09-18 ENCOUNTER — Other Ambulatory Visit (INDEPENDENT_AMBULATORY_CARE_PROVIDER_SITE_OTHER): Payer: BLUE CROSS/BLUE SHIELD

## 2018-09-18 DIAGNOSIS — R638 Other symptoms and signs concerning food and fluid intake: Secondary | ICD-10-CM

## 2018-09-18 DIAGNOSIS — Z136 Encounter for screening for cardiovascular disorders: Secondary | ICD-10-CM | POA: Diagnosis not present

## 2018-09-18 DIAGNOSIS — K5901 Slow transit constipation: Secondary | ICD-10-CM

## 2018-09-18 DIAGNOSIS — Z13220 Encounter for screening for lipoid disorders: Secondary | ICD-10-CM | POA: Diagnosis not present

## 2018-09-18 LAB — LIPID PANEL
Cholesterol: 165 mg/dL (ref 0–200)
HDL: 56.8 mg/dL (ref >39.00)
LDL CALC: 99 mg/dL (ref 0–99)
NonHDL: 107.75
TRIGLYCERIDES: 45 mg/dL (ref 0.0–149.0)
Total CHOL/HDL Ratio: 3
VLDL: 9 mg/dL (ref 0.0–40.0)

## 2018-09-18 LAB — CBC WITH DIFFERENTIAL/PLATELET
Basophils Absolute: 0 10*3/uL (ref 0.0–0.1)
Basophils Relative: 0.5 % (ref 0.0–3.0)
EOS PCT: 0 % (ref 0.0–5.0)
Eosinophils Absolute: 0 10*3/uL (ref 0.0–0.7)
HCT: 40.8 % (ref 36.0–46.0)
Hemoglobin: 13.9 g/dL (ref 12.0–15.0)
LYMPHS ABS: 1.4 10*3/uL (ref 0.7–4.0)
Lymphocytes Relative: 27.2 % (ref 12.0–46.0)
MCHC: 34 g/dL (ref 30.0–36.0)
MCV: 88.3 fl (ref 78.0–100.0)
MONOS PCT: 7.2 % (ref 3.0–12.0)
Monocytes Absolute: 0.4 10*3/uL (ref 0.1–1.0)
NEUTROS ABS: 3.3 10*3/uL (ref 1.4–7.7)
Neutrophils Relative %: 65.1 % (ref 43.0–77.0)
PLATELETS: 312 10*3/uL (ref 150.0–400.0)
RBC: 4.62 Mil/uL (ref 3.87–5.11)
RDW: 13.3 % (ref 11.5–15.5)
WBC: 5.1 10*3/uL (ref 4.0–10.5)

## 2018-09-18 LAB — COMPREHENSIVE METABOLIC PANEL
ALT: 10 U/L (ref 0–35)
AST: 11 U/L (ref 0–37)
Albumin: 4.1 g/dL (ref 3.5–5.2)
Alkaline Phosphatase: 44 U/L (ref 39–117)
BUN: 14 mg/dL (ref 6–23)
CHLORIDE: 104 meq/L (ref 96–112)
CO2: 30 mEq/L (ref 19–32)
Calcium: 9.4 mg/dL (ref 8.4–10.5)
Creatinine, Ser: 0.74 mg/dL (ref 0.40–1.20)
GFR: 90.65 mL/min (ref >60.00)
GLUCOSE: 90 mg/dL (ref 70–99)
POTASSIUM: 4.5 meq/L (ref 3.5–5.1)
SODIUM: 140 meq/L (ref 135–145)
Total Bilirubin: 1 mg/dL (ref 0.2–1.2)
Total Protein: 6.5 g/dL (ref 6.0–8.3)

## 2018-09-19 ENCOUNTER — Encounter: Payer: Self-pay | Admitting: Physician Assistant

## 2018-09-23 LAB — INSULIN, FREE (BIOACTIVE): Insulin, Free: 6.9 u[IU]/mL (ref 1.5–14.9)

## 2018-09-24 ENCOUNTER — Other Ambulatory Visit: Payer: Self-pay | Admitting: Physician Assistant

## 2018-09-24 MED ORDER — METFORMIN HCL 500 MG PO TABS: 500.0000 mg | ORAL_TABLET | Freq: Every day | ORAL | 0 refills | Status: DC

## 2018-10-07 DIAGNOSIS — Z1231 Encounter for screening mammogram for malignant neoplasm of breast: Secondary | ICD-10-CM | POA: Diagnosis not present

## 2018-10-07 DIAGNOSIS — N951 Menopausal and female climacteric states: Secondary | ICD-10-CM | POA: Diagnosis not present

## 2018-10-07 DIAGNOSIS — Z01419 Encounter for gynecological examination (general) (routine) without abnormal findings: Secondary | ICD-10-CM | POA: Diagnosis not present

## 2018-10-07 DIAGNOSIS — R6882 Decreased libido: Secondary | ICD-10-CM | POA: Diagnosis not present

## 2018-10-07 DIAGNOSIS — Z130 Encounter for screening for diseases of the blood and blood-forming organs and certain disorders involving the immune mechanism: Secondary | ICD-10-CM | POA: Diagnosis not present

## 2018-10-17 ENCOUNTER — Other Ambulatory Visit: Payer: Self-pay | Admitting: Physician Assistant

## 2018-12-28 ENCOUNTER — Other Ambulatory Visit: Payer: Self-pay | Admitting: Internal Medicine

## 2019-01-05 ENCOUNTER — Other Ambulatory Visit: Payer: Self-pay | Admitting: Physician Assistant

## 2019-01-05 ENCOUNTER — Encounter: Payer: Self-pay | Admitting: Physician Assistant

## 2019-01-05 MED ORDER — ESOMEPRAZOLE MAGNESIUM 40 MG PO CPDR: 40.0000 mg | DELAYED_RELEASE_CAPSULE | Freq: Two times a day (BID) | ORAL | 1 refills | Status: DC

## 2019-01-07 ENCOUNTER — Encounter: Payer: Self-pay | Admitting: Physician Assistant

## 2019-01-08 ENCOUNTER — Encounter: Payer: Self-pay | Admitting: Physician Assistant

## 2019-01-09 MED ORDER — ESOMEPRAZOLE MAGNESIUM 40 MG PO CPDR: 40.0000 mg | DELAYED_RELEASE_CAPSULE | Freq: Two times a day (BID) | ORAL | 1 refills | Status: AC

## 2019-01-25 ENCOUNTER — Other Ambulatory Visit: Payer: Self-pay | Admitting: Internal Medicine

## 2019-01-26 DIAGNOSIS — F3161 Bipolar disorder, current episode mixed, mild: Secondary | ICD-10-CM | POA: Diagnosis not present

## 2019-01-26 DIAGNOSIS — F909 Attention-deficit hyperactivity disorder, unspecified type: Secondary | ICD-10-CM | POA: Diagnosis not present

## 2019-02-25 DIAGNOSIS — R61 Generalized hyperhidrosis: Secondary | ICD-10-CM | POA: Diagnosis not present

## 2019-03-03 DIAGNOSIS — F520 Hypoactive sexual desire disorder: Secondary | ICD-10-CM | POA: Diagnosis not present

## 2019-03-03 DIAGNOSIS — N951 Menopausal and female climacteric states: Secondary | ICD-10-CM | POA: Diagnosis not present

## 2019-04-07 ENCOUNTER — Telehealth: Payer: Self-pay | Admitting: *Deleted

## 2019-04-07 NOTE — Telephone Encounter (Signed)
Left message on voicemail to call office. Needs Virtual visit to follow up on Medication request Metformin.

## 2019-04-09 ENCOUNTER — Ambulatory Visit (INDEPENDENT_AMBULATORY_CARE_PROVIDER_SITE_OTHER): Payer: BLUE CROSS/BLUE SHIELD | Admitting: Physician Assistant

## 2019-04-09 ENCOUNTER — Encounter: Payer: Self-pay | Admitting: Physician Assistant

## 2019-04-09 NOTE — Progress Notes (Signed)
Patient did not show for virtual visit.  °

## 2019-07-09 DIAGNOSIS — F909 Attention-deficit hyperactivity disorder, unspecified type: Secondary | ICD-10-CM | POA: Diagnosis not present

## 2019-07-09 DIAGNOSIS — F3161 Bipolar disorder, current episode mixed, mild: Secondary | ICD-10-CM | POA: Diagnosis not present

## 2019-11-05 DIAGNOSIS — F3161 Bipolar disorder, current episode mixed, mild: Secondary | ICD-10-CM | POA: Diagnosis not present

## 2019-11-05 DIAGNOSIS — F909 Attention-deficit hyperactivity disorder, unspecified type: Secondary | ICD-10-CM | POA: Diagnosis not present

## 2019-11-12 ENCOUNTER — Other Ambulatory Visit: Payer: Self-pay

## 2019-11-12 ENCOUNTER — Ambulatory Visit (INDEPENDENT_AMBULATORY_CARE_PROVIDER_SITE_OTHER): Payer: BLUE CROSS/BLUE SHIELD | Admitting: Physician Assistant

## 2019-11-12 ENCOUNTER — Encounter: Payer: Self-pay | Admitting: Physician Assistant

## 2019-11-12 VITALS — BP 120/80 | HR 57 | Temp 98.6°F | Ht 65.5 in | Wt 184.2 lb

## 2019-11-12 DIAGNOSIS — Z136 Encounter for screening for cardiovascular disorders: Secondary | ICD-10-CM

## 2019-11-12 DIAGNOSIS — E669 Obesity, unspecified: Secondary | ICD-10-CM

## 2019-11-12 DIAGNOSIS — F988 Other specified behavioral and emotional disorders with onset usually occurring in childhood and adolescence: Secondary | ICD-10-CM

## 2019-11-12 DIAGNOSIS — F419 Anxiety disorder, unspecified: Secondary | ICD-10-CM

## 2019-11-12 DIAGNOSIS — F329 Major depressive disorder, single episode, unspecified: Secondary | ICD-10-CM

## 2019-11-12 DIAGNOSIS — Z13220 Encounter for screening for lipoid disorders: Secondary | ICD-10-CM | POA: Diagnosis not present

## 2019-11-12 DIAGNOSIS — Z0001 Encounter for general adult medical examination with abnormal findings: Secondary | ICD-10-CM | POA: Diagnosis not present

## 2019-11-12 DIAGNOSIS — R635 Abnormal weight gain: Secondary | ICD-10-CM

## 2019-11-12 LAB — CBC WITH DIFFERENTIAL/PLATELET
Basophils Absolute: 0 10*3/uL (ref 0.0–0.1)
Basophils Relative: 0.3 % (ref 0.0–3.0)
Eosinophils Absolute: 0 10*3/uL (ref 0.0–0.7)
Eosinophils Relative: 0.1 % (ref 0.0–5.0)
HCT: 41.2 % (ref 36.0–46.0)
Hemoglobin: 13.8 g/dL (ref 12.0–15.0)
Lymphocytes Relative: 29.3 % (ref 12.0–46.0)
Lymphs Abs: 1.6 10*3/uL (ref 0.7–4.0)
MCHC: 33.5 g/dL (ref 30.0–36.0)
MCV: 89.8 fl (ref 78.0–100.0)
Monocytes Absolute: 0.5 10*3/uL (ref 0.1–1.0)
Monocytes Relative: 8.6 % (ref 3.0–12.0)
Neutro Abs: 3.4 10*3/uL (ref 1.4–7.7)
Neutrophils Relative %: 61.7 % (ref 43.0–77.0)
Platelets: 301 10*3/uL (ref 150.0–400.0)
RBC: 4.58 Mil/uL (ref 3.87–5.11)
RDW: 13 % (ref 11.5–15.5)
WBC: 5.5 10*3/uL (ref 4.0–10.5)

## 2019-11-12 LAB — COMPREHENSIVE METABOLIC PANEL
ALT: 15 U/L (ref 0–35)
AST: 13 U/L (ref 0–37)
Albumin: 4.2 g/dL (ref 3.5–5.2)
Alkaline Phosphatase: 44 U/L (ref 39–117)
BUN: 11 mg/dL (ref 6–23)
CO2: 26 mEq/L (ref 19–32)
Calcium: 9.1 mg/dL (ref 8.4–10.5)
Chloride: 104 mEq/L (ref 96–112)
Creatinine, Ser: 0.68 mg/dL (ref 0.40–1.20)
GFR: 93.54 mL/min (ref >60.00)
Glucose, Bld: 81 mg/dL (ref 70–99)
Potassium: 4.3 mEq/L (ref 3.5–5.1)
Sodium: 138 mEq/L (ref 135–145)
Total Bilirubin: 0.7 mg/dL (ref 0.2–1.2)
Total Protein: 6.3 g/dL (ref 6.0–8.3)

## 2019-11-12 LAB — LIPID PANEL
Cholesterol: 190 mg/dL (ref 0–200)
HDL: 62.3 mg/dL (ref >39.00)
LDL Cholesterol: 113 mg/dL — ABNORMAL HIGH (ref 0–99)
NonHDL: 127.31
Total CHOL/HDL Ratio: 3
Triglycerides: 71 mg/dL (ref 0.0–149.0)
VLDL: 14.2 mg/dL (ref 0.0–40.0)

## 2019-11-12 LAB — HEMOGLOBIN A1C: Hgb A1c MFr Bld: 5 % (ref 4.6–6.5)

## 2019-11-12 LAB — TSH: TSH: 2.31 u[IU]/mL (ref 0.35–4.50)

## 2019-11-12 NOTE — Progress Notes (Signed)
I acted as a Neurosurgeon for [employer], PA-C Corky Mull, LPN   Subjective:    Mia Hunter is a 43 y.o. female and is here for a comprehensive physical exam.   HPI  Health Maintenance Due  Topic Date Due  . MAMMOGRAM  10/19/1992    Acute Concerns: Weight gain -- has had ongoing weight gain. She is bothered by how her body looks. She is working out at [employer] regularly and hopes that it doesn't close soon. She has issues with controlling cravings and emotional eating. We trialed metformin and she did not feel like  Chronic Issues: ADHD, depression and anxiety -- sees psych for management of effexor and vyvanse. She sees Tamela Oddi for this. She just saw her for management and is unsure if her regimen is working for her. She states that although she doesn't like taking medications daily, she takes these on a regular basis. She has had worsening anxiety and depression due to the pandemic. Denies SI/HI.  Health Maintenance: Immunizations -- UTD declined Flu Colonoscopy -- N/A Mammogram -- overdue PAP -- overdue will schedule with GYN Bone Density -- N/A Diet -- overall well balanced, but eats large portions of carbs Sleep habits -- better with effexor Exercise -- as able, goes to the gym Weight -- Weight: 184 lb 4 oz (83.6 kg)  Weight history: Wt Readings from Last 10 Encounters:  11/12/19 184 lb 4 oz (83.6 kg)  09/17/18 180 lb 6.1 oz (81.8 kg)  03/14/17 175 lb (79.4 kg)  03/19/16 171 lb 4 oz (77.7 kg)  11/15/15 175 lb (79.4 kg)  08/31/15 170 lb 12.8 oz (77.5 kg)  02/28/15 170 lb (77.1 kg)  02/16/15 170 lb (77.1 kg)  12/31/14 171 lb 2 oz (77.6 kg)  01/27/14 181 lb 9.6 oz (82.4 kg)   Patient's last menstrual period was 02/15/2015 (exact date). Alcohol use: minimal Tobacco use: none  Depression screen PHQ 2/9 11/12/2019  Decreased Interest 2  Down, Depressed, Hopeless 0  PHQ - 2 Score 2  Altered sleeping 2  Tired, decreased energy 3  Change in appetite 3   Feeling bad or failure about yourself  0  Trouble concentrating 3  Moving slowly or fidgety/restless 2  Suicidal thoughts 0  PHQ-9 Score 15  Difficult doing work/chores Very difficult     Other providers/specialists: Patient Care Team: Jarold Motto, [place] as PCP - General (Physician Assistant)    PMHx, SurgHx, SocialHx, Medications, and Allergies were reviewed in the Visit Navigator and updated as appropriate.   Past Medical History:  Diagnosis Date  . 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 Arthritis    lower back  . GERD (gastroesophageal reflux disease)   . HSV infection      Past Surgical History:  Procedure Laterality Date  . ANAL RECTAL MANOMETRY N/A 01/02/2016   Procedure: ANO RECTAL MANOMETRY;  Surgeon: Iva Boop, MD;  Location: [facility];  Service: Endoscopy;  Laterality: N/A;  . BILATERAL SALPINGECTOMY Bilateral 02/28/2015   Procedure: BILATERAL SALPINGECTOMY;  Surgeon: Huel Cote, MD;  Location: [facility];  Service: Gynecology;  Laterality: Bilateral;  . HERNIA REPAIR Right 10/2010   done by [facility]  . LAPAROSCOPIC ASSISTED VAGINAL HYSTERECTOMY N/A 02/28/2015   Procedure: LAPAROSCOPIC ASSISTED VAGINAL HYSTERECTOMY;  Surgeon: Huel Cote, MD;  Location: [facility];  Service: Gynecology;  Laterality: N/A;  . TUBAL LIGATION    . WISDOM TOOTH EXTRACTION       Family History  Problem Relation  Age of Onset  . Hypercholesterolemia Mother   . Cancer Father        lump on the side of the neck; radiation and chemo; none since  . Diabetes Father   . Hypercholesterolemia Father   . Breast cancer Paternal Aunt   . Breast cancer Cousin        paternal  . Breast cancer Cousin        maternal  . Diabetes Paternal Grandmother     Social History   Tobacco Use  . Smoking status: Former Smoker    Packs/day: 0.50    Years: 15.00    Pack years: 7.50    Types: Cigarettes    Quit date: 09/23/2010    Years since quitting: 9.1  . Smokeless tobacco: Never  Used  Substance Use Topics  . Alcohol use: Yes    Alcohol/week: 0.0 standard drinks    Comment: occasional  . Drug use: No    Review of Systems:   Review of Systems  Constitutional: Negative for chills, fever, malaise/fatigue and weight loss.  HENT: Negative for hearing loss, sinus pain and sore throat.   Respiratory: Negative for cough and hemoptysis.   Cardiovascular: Negative for chest pain, palpitations, leg swelling and PND.  Gastrointestinal: Negative for abdominal pain, constipation, diarrhea, heartburn, nausea and vomiting.  Genitourinary: Negative for dysuria, frequency and urgency.  Musculoskeletal: Negative for back pain, myalgias and neck pain.  Skin: Negative for itching and rash.  Neurological: Negative for dizziness, tingling, seizures and headaches.  Endo/Heme/Allergies: Negative for polydipsia.  Psychiatric/Behavioral: Positive for depression. Negative for suicidal ideas. The patient is nervous/anxious.     Objective:   BP 120/80 (BP Location: Left Arm, Patient Position: Sitting, Cuff Size: Normal)   Pulse (!) 57   Temp 98.6 F (37 C) (Temporal)   Ht 5' 5.5" (1.664 m)   Wt 184 lb 4 oz (83.6 kg)   LMP 02/15/2015 (Exact Date)   SpO2 97%   BMI 30.19 kg/m  Body mass index is 30.19 kg/m.   General Appearance:    Alert, cooperative, no distress, appears stated age  Head:    Normocephalic, without obvious abnormality, atraumatic  Eyes:    PERRL, conjunctiva/corneas clear, EOM's intact, fundi    benign, both eyes  Ears:    Normal TM's and external ear canals, both ears  Nose:   Nares normal, septum midline, mucosa normal, no drainage    or sinus tenderness  Throat:   Lips, mucosa, and tongue normal; teeth and gums normal  Neck:   Supple, symmetrical, trachea midline, no adenopathy;    thyroid:  no enlargement/tenderness/nodules; no carotid   bruit or JVD  Back:     Symmetric, no curvature, ROM normal, no CVA tenderness  Lungs:     Clear to auscultation  bilaterally, respirations unlabored  Chest Wall:    No tenderness or deformity   Heart:    Regular rate and rhythm, S1 and S2 normal, no murmur, rub or gallop  Breast Exam:    Deferred  Abdomen:     Soft, non-tender, bowel sounds active all four quadrants,    no masses, no organomegaly  Genitalia:    Deferred  Extremities:   Extremities normal, atraumatic, no cyanosis or edema  Pulses:   2+ and symmetric all extremities  Skin:   Skin color, texture, turgor normal, no rashes or lesions  Lymph nodes:   Cervical, supraclavicular, and axillary nodes normal  Neurologic:   CNII-XII intact, normal strength, sensation  and reflexes    throughout     Assessment/Plan:   Areya was seen today for annual exam.  Diagnoses and all orders for this visit:  Encounter for general adult medical examination with abnormal findings Today patient counseled on age appropriate routine health concerns for screening and prevention, each reviewed and up to date or declined. Immunizations reviewed and up to date or declined. Labs ordered and reviewed. Risk factors for depression reviewed and negative. Hearing function and visual acuity are intact. ADLs screened and addressed as needed. Functional ability and level of safety reviewed and appropriate. Education, counseling and referrals performed based on assessed risks today. Patient provided with a copy of personalized plan for preventive services.  Attention deficit disorder, unspecified hyperactivity presence; Anxiety and depression Managed by psych. Did discuss some potential options with her, but ultimately is under discretion of psych. -     TSH  Weight gain; Obesity Will trial Ozempic, after labs have returned. Continue to work on exercise and portion control. Follow-up in 1 month. -     CBC with Differential/Platelet -     Comprehensive metabolic panel -     Hemoglobin A1c -     TSH  Encounter for lipid screening for cardiovascular disease -     Lipid  panel   Well Adult Exam: Labs ordered: Yes. Patient counseling was done. See below for items discussed. Discussed the patient's BMI.  The BMI is not in the acceptable range; BMI management plan is completed Follow up in 1 month. Breast cancer screening: overdue. Cervical cancer screening: overdue -- has gyn   Patient Counseling: [x]    Nutrition: Stressed importance of moderation in sodium/caffeine intake, saturated fat and cholesterol, caloric balance, sufficient intake of fresh fruits, vegetables, fiber, calcium, iron, and 1 mg of folate supplement per day (for females capable of pregnancy).  [x]    Stressed the importance of regular exercise.   [x]    Substance Abuse: Discussed cessation/primary prevention of tobacco, alcohol, or other drug use; driving or other dangerous activities under the influence; availability of treatment for abuse.   [x]    Injury prevention: Discussed safety belts, safety helmets, smoke detector, smoking near bedding or upholstery.   [x]    Sexuality: Discussed sexually transmitted diseases, partner selection, use of condoms, avoidance of unintended pregnancy  and contraceptive alternatives.  [x]    Dental health: Discussed importance of regular tooth brushing, flossing, and dental visits.  [x]    Health maintenance and immunizations reviewed. Please refer to Health maintenance section.   CMA or LPN served as scribe during this visit. History, Physical, and Plan performed by medical provider. The above documentation has been reviewed and is accurate and complete.   Inda Coke, PA-C [place]

## 2019-11-12 NOTE — Patient Instructions (Signed)
It was great to see you!  Please go to the lab for blood work.   I will be in touch with starting Ozempic.  Our office will call you with your results unless you have chosen to receive results via MyChart.  If your blood work is normal we will follow-up each year for physicals and as scheduled for chronic medical problems.  If anything is abnormal we will treat accordingly and get you in for a follow-up.  Take care,  [facility] Maintenance, Female Adopting a healthy lifestyle and getting preventive care are important in promoting health and wellness. Ask your health care provider about:  The right schedule for you to have regular tests and exams.  Things you can do on your own to prevent diseases and keep yourself healthy. What should I know about diet, weight, and exercise? Eat a healthy diet   Eat a diet that includes plenty of vegetables, fruits, low-fat dairy products, and lean protein.  Do not eat a lot of foods that are high in solid fats, added sugars, or sodium. Maintain a healthy weight Body mass index (BMI) is used to identify weight problems. It estimates body fat based on height and weight. Your health care provider can help determine your BMI and help you achieve or maintain a healthy weight. Get regular exercise Get regular exercise. This is one of the most important things you can do for your health. Most adults should:  Exercise for at least 150 minutes each week. The exercise should increase your heart rate and make you sweat (moderate-intensity exercise).  Do strengthening exercises at least twice a week. This is in addition to the moderate-intensity exercise.  Spend less time sitting. Even light physical activity can be beneficial. Watch cholesterol and blood lipids Have your blood tested for lipids and cholesterol at 45 years of age, then have this test every 5 years. Have your cholesterol levels checked more often if:  Your lipid or  cholesterol levels are high.  You are older than 45 years of age.  You are at high risk for heart disease. What should I know about cancer screening? Depending on your health history and family history, you may need to have cancer screening at various ages. This may include screening for:  Breast cancer.  Cervical cancer.  Colorectal cancer.  Skin cancer.  Lung cancer. What should I know about heart disease, diabetes, and high blood pressure? Blood pressure and heart disease  High blood pressure causes heart disease and increases the risk of stroke. This is more likely to develop in people who have high blood pressure readings, are of African descent, or are overweight.  Have your blood pressure checked: ? Every 3-5 years if you are 20-19 years of age. ? Every year if you are 75 years old or older. Diabetes Have regular diabetes screenings. This checks your fasting blood sugar level. Have the screening done:  Once every three years after age 38 if you are at a normal weight and have a low risk for diabetes.  More often and at a younger age if you are overweight or have a high risk for diabetes. What should I know about preventing infection? Hepatitis B If you have a higher risk for hepatitis B, you should be screened for this virus. Talk with your health care provider to find out if you are at risk for hepatitis B infection. Hepatitis C Testing is recommended for:  Everyone born from [birth year] through 1967.  Anyone with known risk factors for hepatitis C. Sexually transmitted infections (STIs)  Get screened for STIs, including gonorrhea and chlamydia, if: ? You are sexually active and are younger than 45 years of age. ? You are older than 45 years of age and your health care provider tells you that you are at risk for this type of infection. ? Your sexual activity has changed since you were last screened, and you are at increased risk for chlamydia or gonorrhea. Ask your  health care provider if you are at risk.  Ask your health care provider about whether you are at high risk for HIV. Your health care provider may recommend a prescription medicine to help prevent HIV infection. If you choose to take medicine to prevent HIV, you should first get tested for HIV. You should then be tested every 3 months for as long as you are taking the medicine. Pregnancy  If you are about to stop having your period (premenopausal) and you may become pregnant, seek counseling before you get pregnant.  Take 400 to 800 micrograms (mcg) of folic acid every day if you become pregnant.  Ask for birth control (contraception) if you want to prevent pregnancy. Osteoporosis and menopause Osteoporosis is a disease in which the bones lose minerals and strength with aging. This can result in bone fractures. If you are 67 years old or older, or if you are at risk for osteoporosis and fractures, ask your health care provider if you should:  Be screened for bone loss.  Take a calcium or vitamin D supplement to lower your risk of fractures.  Be given hormone replacement therapy (HRT) to treat symptoms of menopause. Follow these instructions at home: Lifestyle  Do not use any products that contain nicotine or tobacco, such as cigarettes, e-cigarettes, and chewing tobacco. If you need help quitting, ask your health care provider.  Do not use street drugs.  Do not share needles.  Ask your health care provider for help if you need support or information about quitting drugs. Alcohol use  Do not drink alcohol if: ? Your health care provider tells you not to drink. ? You are pregnant, may be pregnant, or are planning to become pregnant.  If you drink alcohol: ? Limit how much you use to 0-1 drink a day. ? Limit intake if you are breastfeeding.  Be aware of how much alcohol is in your drink. In the [place] one drink equals one 12 oz bottle of beer (355 mL), one 5 oz glass of wine (148  mL), or one 1 oz glass of hard liquor (44 mL). General instructions  Schedule regular health, dental, and eye exams.  Stay current with your vaccines.  Tell your health care provider if: ? You often feel depressed. ? You have ever been abused or do not feel safe at home. Summary  Adopting a healthy lifestyle and getting preventive care are important in promoting health and wellness.  Follow your health care provider's instructions about healthy diet, exercising, and getting tested or screened for diseases.  Follow your health care provider's instructions on monitoring your cholesterol and blood pressure. This information is not intended to replace advice given to you by your health care provider. Make sure you discuss any questions you have with your health care provider. Document Released: 06/25/2011 Document Revised: 12/03/2018 Document Reviewed: 12/03/2018 Elsevier Patient Education  2020 [employer].

## 2019-11-13 ENCOUNTER — Encounter: Payer: Self-pay | Admitting: Physician Assistant

## 2019-11-13 NOTE — Telephone Encounter (Signed)
Spoke to pt, told her I do have a sample of Ozempic for her to pickup and [facility] would like you to do 0.25 mg weekly x 4 weeks and then make virtual visit in 4 weeks. Pt verbalized understanding and will come by today to pickup. Told pt that is fine # given to call when here.

## 2019-11-13 NOTE — Telephone Encounter (Signed)
Tried to contact pt busy will try again later.

## 2019-12-23 ENCOUNTER — Encounter: Payer: Self-pay | Admitting: Physician Assistant

## 2019-12-23 ENCOUNTER — Ambulatory Visit (INDEPENDENT_AMBULATORY_CARE_PROVIDER_SITE_OTHER): Payer: BLUE CROSS/BLUE SHIELD | Admitting: Physician Assistant

## 2019-12-23 VITALS — Wt 180.8 lb

## 2019-12-23 DIAGNOSIS — E669 Obesity, unspecified: Secondary | ICD-10-CM

## 2019-12-23 MED ORDER — OZEMPIC (0.25 OR 0.5 MG/DOSE) 2 MG/1.5ML ~~LOC~~ SOPN: 0.2500 mg | PEN_INJECTOR | SUBCUTANEOUS | 1 refills | Status: DC

## 2019-12-23 NOTE — Progress Notes (Signed)
Virtual Visit via Video   I connected with Mia Hunter on 12/23/19 at  4:00 PM EST by a video enabled telemedicine application and verified that I am speaking with the correct person using two identifiers. Location patient: Home Location provider: [facility], Office Persons participating in the virtual visit: Mia Hunter, L[employer] PA-C, Mia Mull, LPN   I discussed the limitations of evaluation and management by telemedicine and the availability of in person appointments. The patient expressed understanding and agreed to proceed.  I acted as a Neurosurgeon for [employer], PA-C [employer], LPN  Subjective:   HPI:   Obesity Pt following up today, was started on Ozempic 0.25 mg weekly at last visit 11/12/2019. Pt said she does have nausea first two mornings or so after taking injection and then goes away. With each week, her nausea has improved. She has also noticed reflux is worse, but it is not significant. Takes daily nexium. She feels as though the medication helps with CHO cravings; doesn't want sweets as much as she usually does.   Wt Readings from Last 3 Encounters:  12/23/19 180 lb 12.8 oz (82 kg)  11/12/19 184 lb 4 oz (83.6 kg)  09/17/18 180 lb 6.1 oz (81.8 kg)      ROS: See pertinent positives and negatives per HPI.  Patient Active Problem List   Diagnosis Date Noted  . Obesity 11/12/2019  . Genital herpes simplex 09/17/2018  . ADD (attention deficit disorder) 03/14/2017  . GERD (gastroesophageal reflux disease) 03/14/2017  . Anxiety 03/14/2017  . Restless leg syndrome 06/06/2012  . Edema 06/06/2012  . Constipation 07/01/2009  . Premenstrual tension syndrome 07/01/2009  . Depression, major, single episode, mild (HCC) 12/09/2007  . Irritable bowel syndrome 12/09/2007  . Back pain 12/09/2007    Social History   Tobacco Use  . Smoking status: Former Smoker    Packs/day: 0.50    Years: 15.00    Pack years: 7.50    Types: Cigarettes      Quit date: 09/23/2010    Years since quitting: 9.2  . Smokeless tobacco: Never Used  Substance Use Topics  . Alcohol use: Yes    Alcohol/week: 0.0 standard drinks    Comment: occasional    Current Outpatient Medications:  .  DOTTI 0.025 MG/24HR, Place 1 patch onto the skin 2 (two) times a week. , Disp: , Rfl:  .  esomeprazole (NEXIUM) 40 MG capsule, Take 1 capsule (40 mg total) by mouth 2 (two) times daily before a meal., Disp: 180 capsule, Rfl: 1 .  ibuprofen (ADVIL,MOTRIN) 600 MG tablet, Take 1 tablet (600 mg total) by mouth every 6 (six) hours as needed for moderate pain., Disp: 30 tablet, Rfl: 0 .  lactulose (CHRONULAC) 10 GM/15ML solution, TAKE 1- 2 TABLESPOONS (15-30ML) BY MOUTH DAILY, Disp: 946 mL, Rfl: 1 .  Semaglutide (OZEMPIC, 0.25 OR 0.5 MG/DOSE, Mia Hunter), Inject 0.25 mg into the skin once a week. , Disp: , Rfl:  .  sodium fluoride (PREVIDENT) 1.1 % GEL dental gel, PreviDent 5000 Booster Plus 1.1 % dental paste, Disp: , Rfl:  .  valACYclovir (VALTREX) 500 MG tablet, Take 500 mg by mouth as needed. , Disp: , Rfl:  .  venlafaxine XR (EFFEXOR-XR) 37.5 MG 24 hr capsule, Take 37.5 mg by mouth daily with breakfast. , Disp: , Rfl:  .  VYVANSE 60 MG capsule, Take 60 mg by mouth every morning., Disp: , Rfl: 0 .  Semaglutide,0.25 or 0.5MG /DOS, (OZEMPIC, 0.25  OR 0.5 MG/DOSE,) 2 MG/1.5ML SOPN, Inject 0.25 mg into the skin once a week., Disp: 1 pen, Rfl: 1  No Known Allergies  Objective:   VITALS: Per patient if applicable, see vitals. GENERAL: Alert, appears well and in no acute distress. HEENT: Atraumatic, conjunctiva clear, no obvious abnormalities on inspection of external nose and ears. NECK: Normal movements of the head and neck. CARDIOPULMONARY: No increased WOB. Speaking in clear sentences. I:E ratio WNL.  MS: Moves all visible extremities without noticeable abnormality. PSYCH: Pleasant and cooperative, well-groomed. Speech normal rate and rhythm. Affect is appropriate. Insight and  judgement are appropriate. Attention is focused, linear, and appropriate.  NEURO: CN grossly intact. Oriented as arrived to appointment on time with no prompting. Moves both UE equally.  SKIN: No obvious lesions, wounds, erythema, or cyanosis noted on face or hands.  Assessment and Plan:   [name] was seen today for weight management.  Diagnoses and all orders for this visit:  Obesity, unspecified classification, unspecified obesity type, unspecified whether serious comorbidity present  Other orders -     Semaglutide,0.25 or 0.5MG /DOS, (OZEMPIC, 0.25 OR 0.5 MG/DOSE,) 2 MG/1.5ML SOPN; Inject 0.25 mg into the skin once a week.   Tolerating well. Will continue for 2 more months, then follow-up with [place] to discuss potential dose increase if warranted. Follow-up sooner if concerns.  . Reviewed expectations re: course of current medical issues. . Discussed self-management of symptoms. . Outlined signs and symptoms indicating need for more acute intervention. . Patient verbalized understanding and all questions were answered. Marland Kitchen Health Maintenance issues including appropriate healthy diet, exercise, and smoking avoidance were discussed with patient. . See orders for this visit as documented in the electronic medical record.  I discussed the assessment and treatment plan with the patient. The patient was provided an opportunity to ask questions and all were answered. The patient agreed with the plan and demonstrated an understanding of the instructions.   The patient was advised to call back or seek an in-person evaluation if the symptoms worsen or if the condition fails to improve as anticipated.   CMA or LPN served as scribe during this visit. History, Physical, and Plan performed by medical provider. The above documentation has been reviewed and is accurate and complete.  Inda Coke, [place] 12/23/2019

## 2020-01-21 DIAGNOSIS — N951 Menopausal and female climacteric states: Secondary | ICD-10-CM | POA: Diagnosis not present

## 2020-01-21 DIAGNOSIS — Z01419 Encounter for gynecological examination (general) (routine) without abnormal findings: Secondary | ICD-10-CM | POA: Diagnosis not present

## 2020-01-21 DIAGNOSIS — Z130 Encounter for screening for diseases of the blood and blood-forming organs and certain disorders involving the immune mechanism: Secondary | ICD-10-CM | POA: Diagnosis not present

## 2020-01-21 DIAGNOSIS — A6004 Herpesviral vulvovaginitis: Secondary | ICD-10-CM | POA: Diagnosis not present

## 2020-01-21 DIAGNOSIS — Z1231 Encounter for screening mammogram for malignant neoplasm of breast: Secondary | ICD-10-CM | POA: Diagnosis not present

## 2020-01-26 ENCOUNTER — Encounter: Payer: Self-pay | Admitting: Physician Assistant

## 2020-03-20 ENCOUNTER — Other Ambulatory Visit: Payer: Self-pay | Admitting: Physician Assistant

## 2020-03-22 ENCOUNTER — Other Ambulatory Visit: Payer: Self-pay | Admitting: Internal Medicine

## 2020-03-23 ENCOUNTER — Other Ambulatory Visit: Payer: Self-pay | Admitting: Physician Assistant

## 2020-04-10 ENCOUNTER — Other Ambulatory Visit: Payer: Self-pay | Admitting: Physician Assistant

## 2020-06-16 DIAGNOSIS — F909 Attention-deficit hyperactivity disorder, unspecified type: Secondary | ICD-10-CM | POA: Diagnosis not present

## 2020-06-16 DIAGNOSIS — F3161 Bipolar disorder, current episode mixed, mild: Secondary | ICD-10-CM | POA: Diagnosis not present

## 2020-07-07 ENCOUNTER — Other Ambulatory Visit: Payer: Self-pay

## 2020-07-07 ENCOUNTER — Telehealth: Payer: Self-pay | Admitting: Physician Assistant

## 2020-07-07 DIAGNOSIS — E669 Obesity, unspecified: Secondary | ICD-10-CM

## 2020-07-07 NOTE — Telephone Encounter (Signed)
LVM for patient to return call. 

## 2020-07-07 NOTE — Telephone Encounter (Signed)
Pt said she is having issues with getting Ozempic prescriptions. She said that her insurance is now making her get it through mail order and it is costing more. She said until she gets this straighten out she is wanting to know if we have samples of ozempic?

## 2020-07-08 ENCOUNTER — Other Ambulatory Visit: Payer: Self-pay

## 2020-07-08 DIAGNOSIS — R635 Abnormal weight gain: Secondary | ICD-10-CM

## 2020-07-08 DIAGNOSIS — E669 Obesity, unspecified: Secondary | ICD-10-CM

## 2020-07-08 DIAGNOSIS — R638 Other symptoms and signs concerning food and fluid intake: Secondary | ICD-10-CM

## 2020-07-08 MED ORDER — OZEMPIC (0.25 OR 0.5 MG/DOSE) 2 MG/1.5ML ~~LOC~~ SOPN: 0.2500 mg | PEN_INJECTOR | SUBCUTANEOUS | 1 refills | Status: DC

## 2020-07-08 NOTE — Telephone Encounter (Signed)
Ok to give samples? Please advise

## 2020-07-08 NOTE — Telephone Encounter (Signed)
Ok to give sample 

## 2020-07-13 NOTE — Telephone Encounter (Signed)
Spoke to pt told her she can come by the office to pick up sample of Ozempic. Pt verbalized understanding.

## 2020-10-28 DIAGNOSIS — F909 Attention-deficit hyperactivity disorder, unspecified type: Secondary | ICD-10-CM | POA: Diagnosis not present

## 2020-10-28 DIAGNOSIS — F3161 Bipolar disorder, current episode mixed, mild: Secondary | ICD-10-CM | POA: Diagnosis not present

## 2021-01-02 ENCOUNTER — Encounter: Payer: Self-pay | Admitting: Physician Assistant

## 2021-01-03 ENCOUNTER — Telehealth: Payer: BC Managed Care – PPO | Admitting: Physician Assistant

## 2021-01-03 ENCOUNTER — Encounter: Payer: Self-pay | Admitting: Physician Assistant

## 2021-01-03 VITALS — Ht 65.5 in | Wt <= 1120 oz

## 2021-01-03 DIAGNOSIS — U071 COVID-19: Secondary | ICD-10-CM

## 2021-01-03 NOTE — Progress Notes (Deleted)
Patient: Mia Hunter MRN: [id number] DOB: [birth year] PCP: Mia Motto, PA     I connected with Mia Hunter on 01/03/21 at @CHLAPPTIME @ by a video enabled telemedicine application and verified that I am speaking with the correct person using two identifiers.  Location patient: Home Location provider: [facility], Office Persons participating in this virtual visit: ***  I discussed the limitations of evaluation and management by telemedicine and the availability of in person appointments. The patient expressed understanding and agreed to proceed.   Subjective:  No chief complaint on file.   HPI: The patient is a 45 y.o. female who presents today for positive Covid results. Symptoms started on 12/24/2020. She says started at mild with a headache over 3 days gradually got worse. Headache, sore throat, coughing, loss of taste and smell. Fever being 101. She has not been abl to get it to break. She has it moore at night.  Review of Systems  Constitutional: Positive for chills, fatigue and fever.  HENT: Positive for congestion. Negative for sore throat.   Respiratory: Positive for cough. Negative for shortness of breath and wheezing.   Neurological: Positive for dizziness and headaches.    Allergies Patient has No Known Allergies.  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  Surgical History Patient  has a past surgical history that includes Hernia repair (Right, 10/2010); Wisdom tooth extraction; Tubal ligation; Laparoscopic assisted vaginal hysterectomy (N/A, 02/28/2015); Bilateral salpingectomy (Bilateral, 02/28/2015); and Anal Rectal manometry (N/A, 01/02/2016).  Family History Pateint's family history includes Breast cancer in her cousin, cousin, and paternal aunt; Cancer in her father; Diabetes in her father and paternal grandmother;  Hypercholesterolemia in her father and mother.  Social History Patient  reports that she quit smoking about 10 years ago. Her smoking use included cigarettes. She has a 7.50 pack-year smoking history. She has never used smokeless tobacco. She reports current alcohol use. She reports that she does not use drugs.    Objective: There were no vitals filed for this visit.  There is no height or weight on file to calculate BMI.  Physical Exam     Assessment/plan:      No follow-ups on file.  Records requested if needed. Time spent with patient: *** minutes, of which >50% was spent in obtaining information about her symptoms, reviweing her previous labs, evaluations, and treatments, counseling her about her conditions (please see discussed topics above), and developing a plan to further investigate it; she had a number of questions which I addressed.    03/01/2016, MD [facility]  01/03/2021

## 2021-01-03 NOTE — Progress Notes (Signed)
Virtual Visit via Video   I connected with Mia Hunter on 01/03/21 at  2:30 PM EST by a video enabled telemedicine application and verified that I am speaking with the correct person using two identifiers. Location patient: Home Location provider: [facility], Office Persons participating in the virtual visit: Anyae W Montoya, [place] PA-C  I discussed the limitations of evaluation and management by telemedicine and the availability of in person appointments. The patient expressed understanding and agreed to proceed.   Subjective:   HPI:   Patient is requesting evaluation for possible COVID-19.  Symptom onset: 12/24/20  Vaccination status: none  Testing results: positive test 12/24/20  Patient endorses the following symptoms: Fever (101 last night), sore throat, poor appetite, chills, body aches, loss of taste/smell Symptoms started gradually and are worsening with time  Patient denies the following symptoms: Chest pain, SOB, wheezing  Treatments tried: antitussives and ibuprofen, Nyquil  Patient risk factors: Current COVID-19 risk of complications score: 1 Smoking status: Mia Hunter  reports that she quit smoking about 10 years ago. Her smoking use included cigarettes. She has a 7.50 pack-year smoking history. She has never used smokeless tobacco. If female, currently pregnant? []   Yes []   No  ROS: See pertinent positives and negatives per HPI.  Patient Active Problem List   Diagnosis Date Noted  . Obesity 11/12/2019  . Genital herpes simplex 09/17/2018  . ADD (attention deficit disorder) 03/14/2017  . GERD (gastroesophageal reflux disease) 03/14/2017  . Anxiety 03/14/2017  . Restless leg syndrome 06/06/2012  . Edema 06/06/2012  . Constipation 07/01/2009  . Premenstrual tension syndrome 07/01/2009  . Depression, major, single episode, mild (HCC) 12/09/2007  . Irritable bowel syndrome 12/09/2007  . Back pain 12/09/2007    Social History    Tobacco Use  . Smoking status: Former Smoker    Packs/day: 0.50    Years: 15.00    Pack years: 7.50    Types: Cigarettes    Quit date: 09/23/2010    Years since quitting: 10.2  . Smokeless tobacco: Never Used  Substance Use Topics  . Alcohol use: Yes    Alcohol/week: 0.0 standard drinks    Comment: occasional    Current Outpatient Medications:  .  DOTTI 0.025 MG/24HR, Place 1 patch onto the skin 2 (two) times a week. , Disp: , Rfl:  .  esomeprazole (NEXIUM) 40 MG capsule, Take 1 capsule (40 mg total) by mouth 2 (two) times daily before a meal., Disp: 180 capsule, Rfl: 1 .  ibuprofen (ADVIL,MOTRIN) 600 MG tablet, Take 1 tablet (600 mg total) by mouth every 6 (six) hours as needed for moderate pain., Disp: 30 tablet, Rfl: 0 .  lactulose (CHRONULAC) 10 GM/15ML solution, TAKE 1- 2 TABLESPOONS (15-30ML) BY MOUTH DAILY, Disp: 946 mL, Rfl: 1 .  Semaglutide (OZEMPIC, 0.25 OR 0.5 MG/DOSE, [place]), Inject 0.25 mg into the skin once a week. , Disp: , Rfl:  .  Semaglutide,0.25 or 0.5MG /DOS, (OZEMPIC, 0.25 OR 0.5 MG/DOSE,) 2 MG/1.5ML SOPN, Inject 0.1875 mLs (0.25 mg total) into the skin once a week., Disp: 12 pen, Rfl: 1 .  sodium fluoride (FLUORISHIELD) 1.1 % GEL dental gel, PreviDent 5000 Booster Plus 1.1 % dental paste, Disp: , Rfl:  .  valACYclovir (VALTREX) 500 MG tablet, Take 500 mg by mouth as needed. , Disp: , Rfl:  .  venlafaxine XR (EFFEXOR-XR) 37.5 MG 24 hr capsule, Take 37.5 mg by mouth daily with breakfast. , Disp: , Rfl:  .  VYVANSE 60 MG capsule, Take 60 mg by mouth every morning., Disp: , Rfl: 0  No Known Allergies  Objective:   VITALS: Per patient if applicable, see vitals. GENERAL: Alert, appears well and in no acute distress. HEENT: Atraumatic, conjunctiva clear, no obvious abnormalities on inspection of external nose and ears. NECK: Normal movements of the head and neck. CARDIOPULMONARY: No increased WOB. Speaking in clear sentences. I:E ratio WNL.  MS: Moves all visible  extremities without noticeable abnormality. PSYCH: Pleasant and cooperative, well-groomed. Speech normal rate and rhythm. Affect is appropriate. Insight and judgement are appropriate. Attention is focused, linear, and appropriate.  NEURO: CN grossly intact. Oriented as arrived to appointment on time with no prompting. Moves both UE equally.  SKIN: No obvious lesions, wounds, erythema, or cyanosis noted on face or hands.  Assessment and Plan:   Mahathi was seen today for covid positive.  Diagnoses and all orders for this visit:  COVID-19   Due to worsening fevers, will send to respiratory clinic for further evaluation and management.  Worsening precautions in the interim, low threshold to go to the ER if any worsening or changes.  Due to deferment of care to respiratory clinic, I discussed with patient that I am not going to charge her for a virtual visit.  I discussed the assessment and treatment plan with the patient. The patient was provided an opportunity to ask questions and all were answered. The patient agreed with the plan and demonstrated an understanding of the instructions.   The patient was advised to call back or seek an in-person evaluation if the symptoms worsen or if the condition fails to improve as anticipated.   CMA or LPN served as scribe during this visit. History, Physical, and Plan performed by medical provider. The above documentation has been reviewed and is accurate and complete.  [place], [place] 01/03/2021

## 2021-01-04 ENCOUNTER — Ambulatory Visit (INDEPENDENT_AMBULATORY_CARE_PROVIDER_SITE_OTHER): Payer: BC Managed Care – PPO | Admitting: Internal Medicine

## 2021-01-04 VITALS — BP 107/57 | HR 58 | Temp 98.7°F

## 2021-01-04 DIAGNOSIS — U071 COVID-19: Secondary | ICD-10-CM | POA: Diagnosis not present

## 2021-01-04 NOTE — Patient Instructions (Addendum)
Most importantly continue to practice the "3 Ws": #1 Wear A Mask;#2 Wash Hands;#3 Wait 6 Feet Apart (Social Distancing) ! Nasal hygiene maneuvers & Flonase as instructed. Stay well-hydrated; drink up to 8 ounces of nondairy fluids every hour while awake if having fever . Take Tylenol every 4 hours as needed if having fever. To protect the liver, the total dose should not exceed 4000 mg total per day (less than EIGHT 500 mg tablets in 24 hour period).  Generic Naprosyn can be taken after each meal to help break the fever as well. Complete blood count  and kidney function tests will be done.  The change in urine color suggest possible dehydration.  At this time we do not have symptoms of a urinary tract infection.

## 2021-01-04 NOTE — Progress Notes (Signed)
Clinic CMA intake note: Pt sympotms: Fever Coughing w/ clear Loss of taste and smell headaches No appetite Pt states she noticed her urine is an extremely yellow like a red tint  Pt states she doesn't know if she has a kidney infection from all the medicine    Patient's initial symptoms began 12/24/2020 as headache which lasted 3 days associated with cough, sore throat, chills, anorexia, loss of smell, and altered taste.  COVID testing was +01/01. The patient is not not vaccinated.  Her son had become COVID-positive before Christmas and the family quarantine.  Subsequently her husband was COVID-positive and was sick for approximately 3-1/2 weeks. She has been using Mucinex DM, ibuprofen, NyQuil, and and cold and flu preparation.  Current symptoms include fever with temperature up to 102.5, altered taste, cough with scant sputum production, and myalgias especially low back pain.  As noted she states that her urine is yellow-orange.  She she denies white stool.  She also has no symptoms of UTI such as dysuria, hematuria, or pyuria.  Significant medical comorbidities present include: PMH of HSV infection. She also has history of IBS, GERD, and anxiety/depression.  She is a former smoker with a 7.5-pack-year history, quitting in 2011.  She is on semaglutide, but current glucose level was 81 and A1c 5%, nondiabetic.  NO PMH of MI, stroke, hypertension, chronic kidney disease, chronic liver disease, emphysema, asthma, sleep apnea,  history of cancer, dyslipidemia, or obesity.  Positive review of systems for Covid infection are documented above in HPI. Not present are: HEENT: Eye redness and discharge (conjunctivitis), nasal congestion, sore throat, anosmia, and altered taste Pulmonary:  tachycardia, hemoptysis, and chest pain GI:  nausea, vomiting, diarrhea Genitourinary: Oliguria or anuria Dermatologic: Rash Neurologic: Headache, dizziness, mental status changes   Physical exam:   Pertinent or positive findings: Nasal erythema is present without purulent exudate.  The remainder of the exam is unremarkable.  General appearance: Adequately nourished; no acute distress, increased work of breathing is present.   Lymphatic: No lymphadenopathy about the head, neck, axilla. Eyes: No conjunctival inflammation or lid edema is present. There is no scleral icterus. Ears:  External ear exam shows no significant lesions or deformities.   Nose:  External nasal examination shows no deformity or inflammation.  Oral exam:  Lips and gums are healthy appearing. There is no oropharyngeal erythema or exudate. Neck:  No thyromegaly, masses, tenderness noted.    Heart:  Normal rate and regular rhythm. S1 and S2 normal without gallop, murmur, click, rub .  Pulse was 65 on recheck. Lungs: Chest clear to auscultation without wheezes, rhonchi, rales, rubs. Abdomen: Bowel sounds are normal. Abdomen is soft and nontender with no organomegaly, hernias, masses. GU: Deferred  Extremities:  No cyanosis, clubbing, edema  Skin: Warm & dry w/o tenting. No significant lesions or rash.  See assessment & plan under each active problem in the Problem List & acutely for this visit

## 2021-01-05 ENCOUNTER — Encounter: Payer: Self-pay | Admitting: Physician Assistant

## 2021-01-06 ENCOUNTER — Encounter: Payer: Self-pay | Admitting: Internal Medicine

## 2021-01-06 LAB — CBC WITH DIFFERENTIAL/PLATELET
Basophils Absolute: 0 10*3/uL (ref 0.0–0.2)
Basos: 0 %
EOS (ABSOLUTE): 0 10*3/uL (ref 0.0–0.4)
Eos: 0 %
Hematocrit: 40.3 % (ref 34.0–46.6)
Hemoglobin: 13.7 g/dL (ref 11.1–15.9)
Immature Grans (Abs): 0 10*3/uL (ref 0.0–0.1)
Immature Granulocytes: 1 %
Lymphocytes Absolute: 0.9 10*3/uL (ref 0.7–3.1)
Lymphs: 27 %
MCH: 28.4 pg (ref 26.6–33.0)
MCHC: 34 g/dL (ref 31.5–35.7)
MCV: 84 fL (ref 79–97)
Monocytes Absolute: 0.3 10*3/uL (ref 0.1–0.9)
Monocytes: 7 %
Neutrophils Absolute: 2.2 10*3/uL (ref 1.4–7.0)
Neutrophils: 65 %
Platelets: 244 10*3/uL (ref 150–450)
RBC: 4.82 x10E6/uL (ref 3.77–5.28)
RDW: 11.8 % (ref 11.7–15.4)
WBC: 3.4 10*3/uL (ref 3.4–10.8)

## 2021-01-06 LAB — BASIC METABOLIC PANEL
BUN/Creatinine Ratio: 16 (ref 9–23)
BUN: 14 mg/dL (ref 6–24)
CO2: 24 mmol/L (ref 20–29)
Calcium: 8.4 mg/dL — ABNORMAL LOW (ref 8.7–10.2)
Chloride: 102 mmol/L (ref 96–106)
Creatinine, Ser: 0.86 mg/dL (ref 0.57–1.00)
GFR calc Af Amer: 94 mL/min/{1.73_m2} (ref >59)
GFR calc non Af Amer: 81 mL/min/{1.73_m2} (ref >59)
Glucose: 108 mg/dL — ABNORMAL HIGH (ref 65–99)
Potassium: 4.8 mmol/L (ref 3.5–5.2)
Sodium: 143 mmol/L (ref 134–144)

## 2021-02-22 DIAGNOSIS — Z1231 Encounter for screening mammogram for malignant neoplasm of breast: Secondary | ICD-10-CM | POA: Diagnosis not present

## 2021-02-22 DIAGNOSIS — N951 Menopausal and female climacteric states: Secondary | ICD-10-CM | POA: Diagnosis not present

## 2021-02-22 DIAGNOSIS — Z1389 Encounter for screening for other disorder: Secondary | ICD-10-CM | POA: Diagnosis not present

## 2021-02-22 DIAGNOSIS — Z130 Encounter for screening for diseases of the blood and blood-forming organs and certain disorders involving the immune mechanism: Secondary | ICD-10-CM | POA: Diagnosis not present

## 2021-02-22 DIAGNOSIS — Z01419 Encounter for gynecological examination (general) (routine) without abnormal findings: Secondary | ICD-10-CM | POA: Diagnosis not present

## 2021-03-02 DIAGNOSIS — F3161 Bipolar disorder, current episode mixed, mild: Secondary | ICD-10-CM | POA: Diagnosis not present

## 2021-03-02 DIAGNOSIS — F909 Attention-deficit hyperactivity disorder, unspecified type: Secondary | ICD-10-CM | POA: Diagnosis not present

## 2021-04-11 DIAGNOSIS — L659 Nonscarring hair loss, unspecified: Secondary | ICD-10-CM | POA: Diagnosis not present

## 2021-07-26 DIAGNOSIS — F3161 Bipolar disorder, current episode mixed, mild: Secondary | ICD-10-CM | POA: Diagnosis not present

## 2021-07-26 DIAGNOSIS — F909 Attention-deficit hyperactivity disorder, unspecified type: Secondary | ICD-10-CM | POA: Diagnosis not present

## 2021-07-27 DIAGNOSIS — F349 Persistent mood [affective] disorder, unspecified: Secondary | ICD-10-CM | POA: Diagnosis not present

## 2021-07-27 DIAGNOSIS — Z713 Dietary counseling and surveillance: Secondary | ICD-10-CM | POA: Diagnosis not present

## 2021-07-27 DIAGNOSIS — E7800 Pure hypercholesterolemia, unspecified: Secondary | ICD-10-CM | POA: Diagnosis not present

## 2021-07-27 DIAGNOSIS — E669 Obesity, unspecified: Secondary | ICD-10-CM | POA: Diagnosis not present

## 2022-03-13 ENCOUNTER — Ambulatory Visit: Payer: Managed Care, Other (non HMO) | Admitting: Physician Assistant

## 2022-03-13 ENCOUNTER — Encounter: Payer: Self-pay | Admitting: Physician Assistant

## 2022-03-13 VITALS — BP 108/70 | HR 60 | Temp 98.3°F | Ht 65.5 in | Wt 199.2 lb

## 2022-03-13 DIAGNOSIS — E669 Obesity, unspecified: Secondary | ICD-10-CM

## 2022-03-13 DIAGNOSIS — N951 Menopausal and female climacteric states: Secondary | ICD-10-CM | POA: Diagnosis not present

## 2022-03-13 DIAGNOSIS — R29818 Other symptoms and signs involving the nervous system: Secondary | ICD-10-CM

## 2022-03-13 LAB — CBC WITH DIFFERENTIAL/PLATELET
Basophils Absolute: 0 10*3/uL (ref 0.0–0.1)
Basophils Relative: 0.1 % (ref 0.0–3.0)
Eosinophils Absolute: 0 10*3/uL (ref 0.0–0.7)
Eosinophils Relative: 0.4 % (ref 0.0–5.0)
HCT: 42.1 % (ref 36.0–46.0)
Hemoglobin: 13.9 g/dL (ref 12.0–15.0)
Lymphocytes Relative: 30.2 % (ref 12.0–46.0)
Lymphs Abs: 1.6 10*3/uL (ref 0.7–4.0)
MCHC: 33.1 g/dL (ref 30.0–36.0)
MCV: 86 fl (ref 78.0–100.0)
Monocytes Absolute: 0.5 10*3/uL (ref 0.1–1.0)
Monocytes Relative: 8.7 % (ref 3.0–12.0)
Neutro Abs: 3.2 10*3/uL (ref 1.4–7.7)
Neutrophils Relative %: 60.6 % (ref 43.0–77.0)
Platelets: 299 10*3/uL (ref 150.0–400.0)
RBC: 4.9 Mil/uL (ref 3.87–5.11)
RDW: 13.7 % (ref 11.5–15.5)
WBC: 5.2 10*3/uL (ref 4.0–10.5)

## 2022-03-13 LAB — COMPREHENSIVE METABOLIC PANEL
ALT: 20 U/L (ref 0–35)
AST: 18 U/L (ref 0–37)
Albumin: 4.6 g/dL (ref 3.5–5.2)
Alkaline Phosphatase: 56 U/L (ref 39–117)
BUN: 17 mg/dL (ref 6–23)
CO2: 29 mEq/L (ref 19–32)
Calcium: 9.8 mg/dL (ref 8.4–10.5)
Chloride: 101 mEq/L (ref 96–112)
Creatinine, Ser: 0.73 mg/dL (ref 0.40–1.20)
GFR: 97.95 mL/min (ref >60.00)
Glucose, Bld: 84 mg/dL (ref 70–99)
Potassium: 4.7 mEq/L (ref 3.5–5.1)
Sodium: 138 mEq/L (ref 135–145)
Total Bilirubin: 0.6 mg/dL (ref 0.2–1.2)
Total Protein: 7 g/dL (ref 6.0–8.3)

## 2022-03-13 LAB — HEMOGLOBIN A1C: Hgb A1c MFr Bld: 5.5 % (ref 4.6–6.5)

## 2022-03-13 LAB — LIPID PANEL
Cholesterol: 201 mg/dL — ABNORMAL HIGH (ref 0–200)
HDL: 68.8 mg/dL (ref >39.00)
LDL Cholesterol: 111 mg/dL — ABNORMAL HIGH (ref 0–99)
NonHDL: 132
Total CHOL/HDL Ratio: 3
Triglycerides: 106 mg/dL (ref 0.0–149.0)
VLDL: 21.2 mg/dL (ref 0.0–40.0)

## 2022-03-13 LAB — TSH: TSH: 2.29 u[IU]/mL (ref 0.35–5.50)

## 2022-03-13 NOTE — Progress Notes (Signed)
Mia Hunter is a 46 y.o. female here for obesity. ? ?History of Present Illness:  ? ?Chief Complaint  ?Patient presents with  ? Obesity  ?  Most of pt's concern is her coming down off the wt. She stated that she has a bad sugar intaken and is trying to break the habit.  ? ? ?HPI ? ?Obesity ?Mia Hunter presents due to concerns about her weight gain over the last couple of months. 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 This position has come with an increase in responsibilities and work hours as well as pay. Since starting the position, she found she has not had enough time to do things around the house let alone have some time to take care of herself. In addition to lack of time in her day to exercise, she also admits to having a problem with in taking high sugar foods multiple times daily.  ? ?In the past Mia Hunter has trialed Ozempic and Metformin but always found that these medications made her acid reflux worse. She is also currently using vyvanse 60 mg daily, but found this medication has not helped decrease her appetite, but it has helped with her mood. At this time she is looking for something that could help her cut these cravings and control her appetite.  ? ?Denies hypoglycemic or hyperglycemic episodes.  ? ? ?Menopausal Syndrome ?[name] was previously using Dotti 0.025 mg transdermal patch but found that it was not beneficial in treating her sx such as hot flashes and mood swings. Due to this she stopped the medication and decided to taken an OTC estrogen supplement Mia Hunter(Estroven) which she has found has benefited her immensely. Pt has remained compliant with Effexor XR 37.5 mg daily with no complications, but is not interested in ever increasing this medication due to not wanting to be increasingly dependent on it. Despite this she is managing well.  ? ? ? ?Suspected OSA ?Upon further discussion, pt does admit that her husband recently voiced concerns that she  could have sleep apnea. Pt states he believed this due to her experiencing daytime sleepiness, recently to the point where she could fall asleep when driving if she wanted to. Although Mia Hunter believes this is mainly caused by her heavy work schedule which could cause her to work 60 hours a week, she is interested in completing a sleep study.  ? ? ?Past Medical History:  ?Diagnosis Date  ? 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 Arthritis   ? lower back  ? GERD (gastroesophageal reflux disease)   ? HSV infection   ? ?  ?Social History  ? ?Tobacco Use  ? Smoking status: Former  ?  Packs/day: 0.50  ?  Years: 15.00  ?  Pack years: 7.50  ?  Types: Cigarettes  ?  Quit date: 09/23/2010  ?  Years since quitting: 11.4  ? Smokeless tobacco: Never  ?Substance Use Topics  ? Alcohol use: Yes  ?  Alcohol/week: 0.0 standard drinks  ?  Comment: occasional  ? Drug use: No  ? ? ?Past Surgical History:  ?Procedure Laterality Date  ? ANAL RECTAL MANOMETRY N/A 01/02/2016  ? Procedure: ANO RECTAL MANOMETRY;  Surgeon: Iva Booparl E Gessner, MD;  Location: [facility];  Service: Endoscopy;  Laterality: N/A;  ? BILATERAL SALPINGECTOMY Bilateral 02/28/2015  ? Procedure: BILATERAL SALPINGECTOMY;  Surgeon: Huel CoteKathy Richardson, MD;  Location: [facility];  Service: Gynecology;  Laterality: Bilateral;  ? HERNIA  REPAIR Right 10/2010  ? done by [facility]  ? LAPAROSCOPIC ASSISTED VAGINAL HYSTERECTOMY N/A 02/28/2015  ? Procedure: LAPAROSCOPIC ASSISTED VAGINAL HYSTERECTOMY;  Surgeon: Huel Cote, MD;  Location: [facility];  Service: Gynecology;  Laterality: N/A;  ? TUBAL LIGATION    ? WISDOM TOOTH EXTRACTION    ? ? ?Family History  ?Problem Relation Age of Onset  ? Hypercholesterolemia Mother   ? Cancer Father   ?     lump on the side of the neck; radiation and chemo; none since  ? Diabetes Father   ? Hypercholesterolemia Father   ? Breast cancer Paternal Aunt   ? Breast cancer Cousin   ?     paternal  ? Breast cancer Cousin   ?     maternal  ? Diabetes Paternal  Grandmother   ? ? ?No Known Allergies ? ?Current Medications:  ? ?Current Outpatient Medications:  ?  esomeprazole (NEXIUM) 40 MG capsule, Take 1 capsule (40 mg total) by mouth 2 (two) times daily before a meal., Disp: 180 capsule, Rfl: 1 ?  ibuprofen (ADVIL,MOTRIN) 600 MG tablet, Take 1 tablet (600 mg total) by mouth every 6 (six) hours as needed for moderate pain., Disp: 30 tablet, Rfl: 0 ?  valACYclovir (VALTREX) 500 MG tablet, Take 500 mg by mouth as needed. , Disp: , Rfl:  ?  venlafaxine XR (EFFEXOR-XR) 37.5 MG 24 hr capsule, Take 37.5 mg by mouth daily with breakfast. , Disp: , Rfl:  ?  VYVANSE 60 MG capsule, Take 60 mg by mouth every morning., Disp: , Rfl: 0 ?  DOTTI 0.025 MG/24HR, Place 1 patch onto the skin 2 (two) times a week.  (Patient not taking: Reported on 03/13/2022), Disp: , Rfl:  ?  lactulose (CHRONULAC) 10 GM/15ML solution, TAKE 1- 2 TABLESPOONS (15-30ML) BY MOUTH DAILY (Patient not taking: Reported on 03/13/2022), Disp: 946 mL, Rfl: 1 ?  Semaglutide (OZEMPIC, 0.25 OR 0.5 MG/DOSE, [place]), Inject 0.25 mg into the skin once a week.  (Patient not taking: Reported on 03/13/2022), Disp: , Rfl:  ?  Semaglutide,0.25 or 0.5MG /DOS, (OZEMPIC, 0.25 OR 0.5 MG/DOSE,) 2 MG/1.5ML SOPN, Inject 0.1875 mLs (0.25 mg total) into the skin once a week. (Patient not taking: Reported on 03/13/2022), Disp: 12 pen, Rfl: 1 ?  sodium fluoride (FLUORISHIELD) 1.1 % GEL dental gel, PreviDent 5000 Booster Plus 1.1 % dental paste (Patient not taking: Reported on 03/13/2022), Disp: , Rfl:   ? ?Review of Systems:  ? ?ROS ?Negative unless otherwise specified per HPI. ?Vitals:  ? ?Vitals:  ? 03/13/22 0928  ?BP: 108/70  ?Pulse: 60  ?Temp: 98.3 ?F (36.8 ?C)  ?SpO2: 98%  ?Weight: 199 lb 3.2 oz (90.4 kg)  ?Height: 5' 5.5" (1.664 m)  ?   ?Body mass index is 32.64 kg/m?. ? ?Physical Exam:  ? ?Physical Exam ?Vitals and nursing note reviewed.  ?Constitutional:   ?   General: She is not in acute distress. ?   Appearance: She is well-developed. She is  not ill-appearing or toxic-appearing.  ?Cardiovascular:  ?   Rate and Rhythm: Normal rate and regular rhythm.  ?   Pulses: Normal pulses.  ?   Heart sounds: Normal heart sounds, S1 normal and S2 normal.  ?Pulmonary:  ?   Effort: Pulmonary effort is normal.  ?   Breath sounds: Normal breath sounds.  ?Skin: ?   General: Skin is warm and dry.  ?Neurological:  ?   Mental Status: She is alert.  ?   GCS: GCS  eye subscore is 4. GCS verbal subscore is 5. GCS motor subscore is 6.  ?Psychiatric:     ?   Speech: Speech normal.     ?   Behavior: Behavior normal. Behavior is cooperative.  ? ? ?Assessment and Plan:  ? ?Obesity, unspecified classification, unspecified obesity type, unspecified whether serious comorbidity present ?Update labs today, will make recommendations accordingly  ?Trial Wegovy 0.25 mg weekly-- provided patient with sample ?Encouraged patient to discuss increasing dosage of Vyvanse with Dr. Kizzie Bane  ?Informed patient to reach out in 4-6 weeks if desire to continue [facility] ?Follow up in 3 months, sooner if concerns  ? ?Suspected sleep apnea ?Placed referral to Neurology for sleep study completion  ?Follow up based on results  ? ?Menopausal Symptoms ?Overall controlled per patient ?Mgmt per psych and gyn ? ?I,Havlyn C Ratchford,acting as a scribe for [employer], PA.,have documented all relevant documentation on the behalf of Jarold Motto, PA,as directed by  Jarold Motto, PA while in the presence of Jarold Motto, [place]. ? ?IJarold Motto, PA, have reviewed all documentation for this visit. The documentation on 03/13/22 for the exam, diagnosis, procedures, and orders are all accurate and complete. ? ? ?Jarold Motto, PA-C ? ?

## 2022-03-13 NOTE — Patient Instructions (Signed)
It was great to see you! ? ?-Sleep study referral placed ?-Update blood work today ?-Talk to [place] about possible Vyvanse increase or alternative regimen ?-I will contact you when I have Wegovy samples (this is the same medication as Ozempic) we can restart this to see if it helps with sugar cravings ? ?Let's follow-up in 3 months, sooner if you have concerns. ? ?If a referral was placed today, you will be contacted for an appointment. Please note that routine referrals can sometimes take up to 3-4 weeks to process. Please call our office if you haven't heard anything after this time frame. ? ?Take care, ? ?Jarold Motto PA-C  ?

## 2022-04-14 ENCOUNTER — Encounter: Payer: Self-pay | Admitting: Physician Assistant

## 2022-04-16 ENCOUNTER — Other Ambulatory Visit: Payer: Self-pay | Admitting: Physician Assistant

## 2022-04-16 MED ORDER — WEGOVY 0.5 MG/0.5ML ~~LOC~~ SOAJ: 0.5000 mg | SUBCUTANEOUS | 2 refills | Status: DC

## 2022-06-12 ENCOUNTER — Ambulatory Visit: Payer: Managed Care, Other (non HMO) | Admitting: Physician Assistant

## 2022-06-12 NOTE — Progress Notes (Incomplete)
Mia Hunter is a 46 y.o. female here for a follow up for obesity.   SCRIBE STATEMENT  History of Present Illness:   No chief complaint on file.   HPI  Obesity  Patient is currently taking Wegovy 0.25 mg weekly.     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    Arthritis    lower back   GERD (gastroesophageal reflux disease)    HSV infection      Social History   Tobacco Use   Smoking status: Former    Packs/day: 0.50    Years: 15.00    Total pack years: 7.50    Types: Cigarettes    Quit date: 09/23/2010    Years since quitting: 11.7   Smokeless tobacco: Never  Substance Use Topics   Alcohol use: Not Currently   Drug use: No    Past Surgical History:  Procedure Laterality Date   ANAL RECTAL MANOMETRY N/A 01/02/2016   Procedure: ANO RECTAL MANOMETRY;  Surgeon: Iva Boop, MD;  Location: [facility];  Service: Endoscopy;  Laterality: N/A;   BILATERAL SALPINGECTOMY Bilateral 02/28/2015   Procedure: BILATERAL SALPINGECTOMY;  Surgeon: Huel Cote, MD;  Location: [facility];  Service: Gynecology;  Laterality: Bilateral;   HERNIA REPAIR Right 10/2010   done by [facility]   LAPAROSCOPIC ASSISTED VAGINAL HYSTERECTOMY N/A 02/28/2015   Procedure: LAPAROSCOPIC ASSISTED VAGINAL HYSTERECTOMY;  Surgeon: Huel Cote, MD;  Location: [facility];  Service: Gynecology;  Laterality: N/A;   TUBAL LIGATION     WISDOM TOOTH EXTRACTION      Family History  Problem Relation Age of Onset   Hypercholesterolemia Mother    Cancer Father        lump on the side of the neck; radiation and chemo; none since   Diabetes Father    Hypercholesterolemia Father    Breast cancer Paternal Aunt    Breast cancer Cousin        paternal   Breast cancer Cousin        maternal   Diabetes Paternal Grandmother     No Known Allergies  Current Medications:   Current Outpatient Medications:    Semaglutide-Weight Management (WEGOVY) 0.5 MG/0.5ML SOAJ,  Inject 0.5 mg into the skin once a week., Disp: 2 mL, Rfl: 2   esomeprazole (NEXIUM) 40 MG capsule, Take 1 capsule (40 mg total) by mouth 2 (two) times daily before a meal., Disp: 180 capsule, Rfl: 1   ibuprofen (ADVIL,MOTRIN) 600 MG tablet, Take 1 tablet (600 mg total) by mouth every 6 (six) hours as needed for moderate pain., Disp: 30 tablet, Rfl: 0   valACYclovir (VALTREX) 500 MG tablet, Take 500 mg by mouth as needed. , Disp: , Rfl:    venlafaxine XR (EFFEXOR-XR) 37.5 MG 24 hr capsule, Take 37.5 mg by mouth daily with breakfast. , Disp: , Rfl:    VYVANSE 60 MG capsule, Take 60 mg by mouth every morning., Disp: , Rfl: 0   Review of Systems:   ROS Negative unless otherwise specified per HPI.   Vitals:   There were no vitals filed for this visit.   There is no height or weight on file to calculate BMI.  Physical Exam:   Physical Exam  Assessment and Plan:   @DIAGLIST @    I,Savera Zaman,acting as a scribe for , PA.,have documented all relevant documentation on the behalf of [employer], PA,as directed by  Jarold Motto, PA while in the presence  of Jarold Motto, [place].   ***  Jarold Motto, PA-C

## 2022-06-16 ENCOUNTER — Encounter: Payer: Self-pay | Admitting: Physician Assistant

## 2022-06-29 ENCOUNTER — Ambulatory Visit: Payer: Managed Care, Other (non HMO) | Admitting: Physician Assistant

## 2022-07-03 ENCOUNTER — Encounter: Payer: Self-pay | Admitting: Physician Assistant

## 2022-07-03 ENCOUNTER — Ambulatory Visit: Payer: Managed Care, Other (non HMO) | Admitting: Physician Assistant

## 2022-07-03 VITALS — BP 100/70 | HR 80 | Temp 98.3°F | Ht 65.5 in | Wt 199.5 lb

## 2022-07-03 DIAGNOSIS — N951 Menopausal and female climacteric states: Secondary | ICD-10-CM

## 2022-07-03 DIAGNOSIS — E669 Obesity, unspecified: Secondary | ICD-10-CM | POA: Diagnosis not present

## 2022-07-03 MED ORDER — SAXENDA 18 MG/3ML ~~LOC~~ SOPN
0.6000 mg | PEN_INJECTOR | Freq: Every day | SUBCUTANEOUS | 2 refills | Status: DC
Start: 2022-07-03 — End: 2024-09-04

## 2022-07-03 MED ORDER — PHENTERMINE HCL 37.5 MG PO TABS
37.5000 mg | ORAL_TABLET | Freq: Every day | ORAL | 0 refills | Status: DC
Start: 2022-07-03 — End: 2024-09-04

## 2022-07-03 MED ORDER — GABAPENTIN 100 MG PO CAPS: ORAL_CAPSULE | ORAL | 1 refills | Status: DC

## 2022-07-03 NOTE — Patient Instructions (Signed)
It was great to see you!  -Start phentermine 37.5 mg -- start 1/2 tablet daily, if tolerated, may increase to full tablet -- follow-up in 3 months for this -I would ideally like for you to take Saxenda daily weight loss injections if I can get approved by your insurance --- we will start this paperwork process and see if we get anywhere and let you know either way  For your hot flashes, trial 100 mg gabapentin nightly. May increase by 100 mg nightly to goal of 300 mg nightly if needed.  Let's follow-up in 3 months, sooner if you have concerns.  If a referral was placed today --> you will be contacted for an appointment. Please note that routine referrals can sometimes take up to 3-4 weeks   Take care,  Jarold Motto PA-C

## 2022-07-03 NOTE — Progress Notes (Signed)
Mia Hunter is a 46 y.o. female here for a follow up of a pre-existing problem.   History of Present Illness:   Chief Complaint  Patient presents with   Obesity    Pt is here to discuss weight loss options.    HPI  Obesity  Patient here for follow-up. We saw her for this issue on 03/13/2022. She was started on Wegovy 0.25 mg weekly at that time. However, this was stopped due to her insurance not covering this. She has noticed decreased appetite and CHO cravings while being on Wegovy. States she was not craving any sugary foods at that time. She has not been taking her Vyvanse 60 mg daily since this does not help with weight. Per pt, she is currently undergoing menopause and thinks this could be contributing to her weight gain as well. She has been currently walking on treadmill. At this time, she knows how to manage her diet -- just lacks motivation and struggles with CHO cravings. She has been eating funnel cakes 3-4 times a week. At this time, she would like to trial different medication for her weight. Denies any other concerning sx.   Menopausal Syndrome  Patient still has been experiencing hot flashes and mood swings. She has tried transdermal patch and Effexor XR 37.5 mg daily with no relief. Has had decreased libido as well. She would like to be started on medication if possible for symptoms.   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    Arthritis    lower back   GERD (gastroesophageal reflux disease)    HSV infection      Social History   Tobacco Use   Smoking status: Former    Packs/day: 0.50    Years: 15.00    Total pack years: 7.50    Types: Cigarettes    Quit date: 09/23/2010    Years since quitting: 11.7   Smokeless tobacco: Never  Substance Use Topics   Alcohol use: Not Currently   Drug use: No    Past Surgical History:  Procedure Laterality Date   ANAL RECTAL MANOMETRY N/A 01/02/2016   Procedure: ANO RECTAL  MANOMETRY;  Surgeon: Iva Boop, MD;  Location: [facility];  Service: Endoscopy;  Laterality: N/A;   BILATERAL SALPINGECTOMY Bilateral 02/28/2015   Procedure: BILATERAL SALPINGECTOMY;  Surgeon: Huel Cote, MD;  Location: [facility];  Service: Gynecology;  Laterality: Bilateral;   HERNIA REPAIR Right 10/2010   done by [facility]   LAPAROSCOPIC ASSISTED VAGINAL HYSTERECTOMY N/A 02/28/2015   Procedure: LAPAROSCOPIC ASSISTED VAGINAL HYSTERECTOMY;  Surgeon: Huel Cote, MD;  Location: [facility];  Service: Gynecology;  Laterality: N/A;   TUBAL LIGATION     WISDOM TOOTH EXTRACTION      Family History  Problem Relation Age of Onset   Hypercholesterolemia Mother    Cancer Father        lump on the side of the neck; radiation and chemo; none since   Diabetes Father    Hypercholesterolemia Father    Breast cancer Paternal Aunt    Breast cancer Cousin        paternal   Breast cancer Cousin        maternal   Diabetes Paternal Grandmother     No Known Allergies  Current Medications:   Current Outpatient Medications:    esomeprazole (NEXIUM) 40 MG capsule, Take 1 capsule (40 mg total) by mouth 2 (two) times daily before a meal., Disp: 180 capsule,  Rfl: 1   ibuprofen (ADVIL,MOTRIN) 600 MG tablet, Take 1 tablet (600 mg total) by mouth every 6 (six) hours as needed for moderate pain., Disp: 30 tablet, Rfl: 0   valACYclovir (VALTREX) 500 MG tablet, Take 500 mg by mouth as needed. , Disp: , Rfl:    Review of Systems:   ROS Negative unless otherwise specified per HPI.   Vitals:   Vitals:   07/03/22 1537  BP: 100/70  Pulse: 80  Temp: 98.3 F (36.8 C)  TempSrc: Temporal  SpO2: 96%  Weight: 199 lb 8 oz (90.5 kg)  Height: 5' 5.5" (1.664 m)     Body mass index is 32.69 kg/m.  Physical Exam:   Physical Exam Vitals and nursing note reviewed.  Constitutional:      General: She is not in acute distress.    Appearance: Normal appearance. She is well-developed. She is not ill-appearing  or toxic-appearing.  HENT:     Head: Normocephalic and atraumatic.  Eyes:     General: Lids are normal.     Extraocular Movements: Extraocular movements intact.     Conjunctiva/sclera: Conjunctivae normal.  Cardiovascular:     Rate and Rhythm: Normal rate and regular rhythm.     Pulses: Normal pulses.     Heart sounds: Normal heart sounds, S1 normal and S2 normal.  Pulmonary:     Effort: Pulmonary effort is normal.     Breath sounds: Normal breath sounds.  Musculoskeletal:        General: Normal range of motion.     Cervical back: Normal range of motion and neck supple.  Skin:    General: Skin is warm and dry.  Neurological:     Mental Status: She is alert and oriented to person, place, and time.     GCS: GCS eye subscore is 4. GCS verbal subscore is 5. GCS motor subscore is 6.  Psychiatric:        Attention and Perception: Attention and perception normal.        Mood and Affect: Mood normal.        Speech: Speech normal.        Behavior: Behavior normal. Behavior is cooperative.        Thought Content: Thought content normal.        Judgment: Judgment normal.     Assessment and Plan:   Obesity, unspecified classification, unspecified obesity type, unspecified whether serious comorbidity present Ongoing Unfortunately weekly GLP-1 RA's are not covered by her insurance for weight loss Previously not a candidate for phentermine as she was taking vyvanse but she is not longer taking this We will send in Saxenda daily to see if her insurance approves  In the meantime, trial 1/2 tablet of Phentermine 37.5 mg daily and increase as tolerated to full tablet -- side effects Follow-up in 3 months, sooner if concerns  Menopausal hot flushes Uncontrolled Hormonal patches, SSRI and SNRI trialed in the past and did not help her symptoms Trial gabapentin 100 mg nightly, increase as tolerated to goal of 300 mg nightly Follow-up in 3 months  I,Savera Zaman,acting as a scribe for  [employer], PA.,have documented all relevant documentation on the behalf of Jarold Motto, PA,as directed by  Jarold Motto, PA while in the presence of Jarold Motto, [place].   I, Jarold Motto, [place], have reviewed all documentation for this visit. The documentation on 07/03/22 for the exam, diagnosis, procedures, and orders are all accurate and complete.  Jarold Motto, PA-C

## 2022-09-27 ENCOUNTER — Other Ambulatory Visit (HOSPITAL_COMMUNITY): Payer: Self-pay

## 2022-09-28 ENCOUNTER — Other Ambulatory Visit (HOSPITAL_COMMUNITY): Payer: Self-pay

## 2022-09-28 MED ORDER — LISDEXAMFETAMINE DIMESYLATE 60 MG PO CAPS
60.0000 mg | ORAL_CAPSULE | Freq: Every morning | ORAL | 0 refills | Status: DC
  Filled 2022-09-28: qty 30, 30d supply, fill #0

## 2022-09-28 MED ORDER — LISDEXAMFETAMINE DIMESYLATE 60 MG PO CAPS
60.0000 mg | ORAL_CAPSULE | Freq: Every morning | ORAL | 0 refills | Status: DC
  Filled 2022-12-05: qty 30, 30d supply, fill #0

## 2022-09-28 MED ORDER — LISDEXAMFETAMINE DIMESYLATE 60 MG PO CAPS
60.0000 mg | ORAL_CAPSULE | Freq: Every morning | ORAL | 0 refills | Status: DC
  Filled 2022-11-05: qty 30, 30d supply, fill #0

## 2022-10-01 ENCOUNTER — Other Ambulatory Visit (HOSPITAL_COMMUNITY): Payer: Self-pay

## 2022-10-03 ENCOUNTER — Ambulatory Visit: Payer: Managed Care, Other (non HMO) | Admitting: Physician Assistant

## 2022-10-16 ENCOUNTER — Other Ambulatory Visit (HOSPITAL_COMMUNITY): Payer: Self-pay

## 2022-11-05 ENCOUNTER — Other Ambulatory Visit (HOSPITAL_COMMUNITY): Payer: Self-pay

## 2022-11-06 ENCOUNTER — Other Ambulatory Visit (HOSPITAL_COMMUNITY): Payer: Self-pay

## 2022-12-05 ENCOUNTER — Other Ambulatory Visit (HOSPITAL_COMMUNITY): Payer: Self-pay

## 2022-12-06 ENCOUNTER — Other Ambulatory Visit (HOSPITAL_COMMUNITY): Payer: Self-pay

## 2022-12-28 ENCOUNTER — Other Ambulatory Visit (HOSPITAL_COMMUNITY): Payer: Self-pay

## 2022-12-28 MED ORDER — LISDEXAMFETAMINE DIMESYLATE 60 MG PO CAPS
60.0000 mg | ORAL_CAPSULE | Freq: Every morning | ORAL | 0 refills | Status: DC
  Filled 2023-03-19: qty 30, 30d supply, fill #0

## 2022-12-28 MED ORDER — LISDEXAMFETAMINE DIMESYLATE 60 MG PO CAPS
60.0000 mg | ORAL_CAPSULE | Freq: Every morning | ORAL | 0 refills | Status: DC
  Filled 2023-01-03: qty 30, 30d supply, fill #0

## 2022-12-28 MED ORDER — LISDEXAMFETAMINE DIMESYLATE 60 MG PO CAPS: 60.0000 mg | ORAL_CAPSULE | Freq: Every morning | ORAL | 0 refills | Status: AC

## 2022-12-31 ENCOUNTER — Other Ambulatory Visit (HOSPITAL_COMMUNITY): Payer: Self-pay

## 2023-01-03 ENCOUNTER — Other Ambulatory Visit (HOSPITAL_COMMUNITY): Payer: Self-pay

## 2023-02-14 ENCOUNTER — Other Ambulatory Visit (HOSPITAL_COMMUNITY): Payer: Self-pay

## 2023-02-14 MED ORDER — LISDEXAMFETAMINE DIMESYLATE 60 MG PO CAPS
60.0000 mg | ORAL_CAPSULE | Freq: Every day | ORAL | 0 refills | Status: DC
Start: 2023-02-14 — End: 2024-09-04
  Filled 2023-02-14: qty 30, 30d supply, fill #0

## 2023-03-19 ENCOUNTER — Other Ambulatory Visit (HOSPITAL_COMMUNITY): Payer: Self-pay

## 2023-03-20 DIAGNOSIS — F3161 Bipolar disorder, current episode mixed, mild: Secondary | ICD-10-CM | POA: Insufficient documentation

## 2023-06-01 ENCOUNTER — Encounter: Payer: Self-pay | Admitting: Physician Assistant

## 2023-08-06 LAB — HM MAMMOGRAPHY

## 2023-08-08 ENCOUNTER — Encounter (INDEPENDENT_AMBULATORY_CARE_PROVIDER_SITE_OTHER): Payer: Self-pay

## 2023-08-19 ENCOUNTER — Encounter: Payer: Self-pay | Admitting: Physician Assistant

## 2023-08-27 ENCOUNTER — Encounter: Payer: Managed Care, Other (non HMO) | Admitting: Physician Assistant

## 2024-09-03 LAB — HM MAMMOGRAPHY

## 2024-09-04 ENCOUNTER — Encounter: Payer: Self-pay | Admitting: Physician Assistant

## 2024-09-04 ENCOUNTER — Ambulatory Visit (INDEPENDENT_AMBULATORY_CARE_PROVIDER_SITE_OTHER): Admitting: Physician Assistant

## 2024-09-04 VITALS — BP 120/84 | HR 60 | Temp 97.3°F | Ht 65.5 in | Wt 185.2 lb

## 2024-09-04 DIAGNOSIS — E559 Vitamin D deficiency, unspecified: Secondary | ICD-10-CM

## 2024-09-04 DIAGNOSIS — M25551 Pain in right hip: Secondary | ICD-10-CM | POA: Diagnosis not present

## 2024-09-04 DIAGNOSIS — Z0000 Encounter for general adult medical examination without abnormal findings: Secondary | ICD-10-CM

## 2024-09-04 DIAGNOSIS — E669 Obesity, unspecified: Secondary | ICD-10-CM | POA: Diagnosis not present

## 2024-09-04 DIAGNOSIS — F909 Attention-deficit hyperactivity disorder, unspecified type: Secondary | ICD-10-CM

## 2024-09-04 DIAGNOSIS — Z79890 Hormone replacement therapy (postmenopausal): Secondary | ICD-10-CM

## 2024-09-04 DIAGNOSIS — Z1211 Encounter for screening for malignant neoplasm of colon: Secondary | ICD-10-CM

## 2024-09-04 DIAGNOSIS — M25552 Pain in left hip: Secondary | ICD-10-CM

## 2024-09-04 DIAGNOSIS — N939 Abnormal uterine and vaginal bleeding, unspecified: Secondary | ICD-10-CM | POA: Insufficient documentation

## 2024-09-04 LAB — COMPREHENSIVE METABOLIC PANEL WITH GFR
ALT: 10 U/L (ref 0–35)
AST: 12 U/L (ref 0–37)
Albumin: 4.5 g/dL (ref 3.5–5.2)
Alkaline Phosphatase: 50 U/L (ref 39–117)
BUN: 12 mg/dL (ref 6–23)
CO2: 31 meq/L (ref 19–32)
Calcium: 9.7 mg/dL (ref 8.4–10.5)
Chloride: 103 meq/L (ref 96–112)
Creatinine, Ser: 0.79 mg/dL (ref 0.40–1.20)
GFR: 87.55 mL/min (ref >60.00)
Glucose, Bld: 86 mg/dL (ref 70–99)
Potassium: 4.6 meq/L (ref 3.5–5.1)
Sodium: 141 meq/L (ref 135–145)
Total Bilirubin: 0.5 mg/dL (ref 0.2–1.2)
Total Protein: 7.3 g/dL (ref 6.0–8.3)

## 2024-09-04 LAB — CBC WITH DIFFERENTIAL/PLATELET
Basophils Absolute: 0 K/uL (ref 0.0–0.1)
Basophils Relative: 0.5 % (ref 0.0–3.0)
Eosinophils Absolute: 0 K/uL (ref 0.0–0.7)
Eosinophils Relative: 0.1 % (ref 0.0–5.0)
HCT: 43 % (ref 36.0–46.0)
Hemoglobin: 14.2 g/dL (ref 12.0–15.0)
Lymphocytes Relative: 26.8 % (ref 12.0–46.0)
Lymphs Abs: 1.4 K/uL (ref 0.7–4.0)
MCHC: 33 g/dL (ref 30.0–36.0)
MCV: 86.3 fl (ref 78.0–100.0)
Monocytes Absolute: 0.3 K/uL (ref 0.1–1.0)
Monocytes Relative: 5.2 % (ref 3.0–12.0)
Neutro Abs: 3.5 K/uL (ref 1.4–7.7)
Neutrophils Relative %: 67.4 % (ref 43.0–77.0)
Platelets: 306 K/uL (ref 150.0–400.0)
RBC: 4.98 Mil/uL (ref 3.87–5.11)
RDW: 13.8 % (ref 11.5–15.5)
WBC: 5.2 K/uL (ref 4.0–10.5)

## 2024-09-04 LAB — VITAMIN D 25 HYDROXY (VIT D DEFICIENCY, FRACTURES): VITD: 41.81 ng/mL (ref 30.00–100.00)

## 2024-09-04 LAB — HEMOGLOBIN A1C: Hgb A1c MFr Bld: 5.8 % (ref 4.6–6.5)

## 2024-09-04 LAB — LIPID PANEL
Cholesterol: 221 mg/dL — ABNORMAL HIGH (ref 0–200)
HDL: 62.6 mg/dL (ref >39.00)
LDL Cholesterol: 139 mg/dL — ABNORMAL HIGH (ref 0–99)
NonHDL: 157.94
Total CHOL/HDL Ratio: 4
Triglycerides: 95 mg/dL (ref 0.0–149.0)
VLDL: 19 mg/dL (ref 0.0–40.0)

## 2024-09-04 LAB — TSH: TSH: 1.9 u[IU]/mL (ref 0.35–5.50)

## 2024-09-04 LAB — VITAMIN B12: Vitamin B-12: 165 pg/mL — ABNORMAL LOW (ref 211–911)

## 2024-09-04 NOTE — Progress Notes (Signed)
 Subjective:    Mia Hunter is a 48 y.o. female and is here for a comprehensive physical exam.  HPI  Health Maintenance Due  Topic Date Due   Hepatitis C Screening  Never done   Cervical Cancer Screening (Pap smear)  02/16/2014   Colonoscopy  10/20/2019   Mammogram  08/05/2024    Discussed the use of AI scribe software for clinical note transcription with the patient, who gave verbal consent to proceed.  History of Present Illness Mia Hunter is a 48 year old female who presents with menopausal symptoms and hip pain.  Menopausal symptoms have persisted for five years following a partial hysterectomy. She uses an estradiol  patch and testosterone cream, with recent dosage adjustments by her gynecologist due to uncontrolled symptoms.  Significant hip pain has been present for several months, exacerbated by prolonged sitting and standing. She requires 600-800 mg of ibuprofen  at least three times a week for pain relief. There is a family history of osteoporosis and arthritis affecting the hip.  Bowel movements occur once or twice a week. Collagen supplements previously provided relief but are now less effective. Fiber supplements were advised against by a gastroenterologist.  Current medications include Vyvanse  and Effexor , which effectively manage mood and energy levels. She occasionally uses vitamin D  supplements.  She works over 50 hours a week, limiting regular exercise. Her diet includes occasional sweets and irregular meal timings, with reduced water intake during cooler months. Sleep quality is poor, with frequent awakenings at 3 AM. No rectal bleeding, numbness, tingling, or tremor. Occasional headaches are noted.    Health Maintenance: Immunizations -- declines Colonoscopy -- overdue Mammogram -- completed yesterday awaiting results PAP -- n/a Bone Density -- none Diet -- has sugar cravings Exercise -- very active job at work; works 50+ hours per week  Sleep  habits -- no major concerns Mood -- overall stable  UTD with dentist? - yes UTD with eye doctor? - no  Weight history: Wt Readings from Last 10 Encounters:  09/04/24 185 lb 4 oz (84 kg)  07/03/22 199 lb 8 oz (90.5 kg)  03/13/22 199 lb 3.2 oz (90.4 kg)  01/03/21 28 lb 3.5 oz (12.8 kg)  12/23/19 180 lb 12.8 oz (82 kg)  11/12/19 184 lb 4 oz (83.6 kg)  09/17/18 180 lb 6.1 oz (81.8 kg)  03/14/17 175 lb (79.4 kg)  03/19/16 171 lb 4 oz (77.7 kg)  11/15/15 175 lb (79.4 kg)   Body mass index is 30.36 kg/m. Patient's last menstrual period was 02/15/2015 (exact date).  Alcohol use:  reports that she does not currently use alcohol.  Tobacco use:  Tobacco Use: Medium Risk (09/04/2024)   Patient History    Smoking Tobacco Use: Former    Smokeless Tobacco Use: Never    Passive Exposure: Not on file   Eligible for lung cancer screening? no     11/12/2019   10:44 AM  Depression screen PHQ 2/9  Decreased Interest 2  Down, Depressed, Hopeless 0  PHQ - 2 Score 2  Altered sleeping 2  Tired, decreased energy 3  Change in appetite 3  Feeling bad or failure about yourself  0  Trouble concentrating 3  Moving slowly or fidgety/restless 2  Suicidal thoughts 0  PHQ-9 Score 15  Difficult doing work/chores Very difficult     Other providers/specialists: Patient Care Team: Job Lukes, [place] as PCP - General (Physician Assistant)    PMHx, SurgHx, SocialHx, Medications, and Allergies were reviewed in the  Visit Navigator and updated as appropriate.   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    Arthritis    lower back   GERD (gastroesophageal reflux disease)    HSV infection      Past Surgical History:  Procedure Laterality Date   ANAL RECTAL MANOMETRY N/A 01/02/2016   Procedure: ANO RECTAL MANOMETRY;  Surgeon: Lupita FORBES Commander, MD;  Location: [facility];  Service: Endoscopy;  Laterality: N/A;   BILATERAL SALPINGECTOMY Bilateral  02/28/2015   Procedure: BILATERAL SALPINGECTOMY;  Surgeon: Nathanel Bunker, MD;  Location: [facility];  Service: Gynecology;  Laterality: Bilateral;   HERNIA REPAIR Right 10/2010   done by [facility]   LAPAROSCOPIC ASSISTED VAGINAL HYSTERECTOMY N/A 02/28/2015   Procedure: LAPAROSCOPIC ASSISTED VAGINAL HYSTERECTOMY;  Surgeon: Nathanel Bunker, MD;  Location: [facility];  Service: Gynecology;  Laterality: N/A;   TUBAL LIGATION     WISDOM TOOTH EXTRACTION       Family History  Problem Relation Age of Onset   Hypercholesterolemia Mother    Cancer Father        lump on the side of the neck; radiation and chemo; none since   Diabetes Father    Hypercholesterolemia Father    Breast cancer Paternal Aunt    Breast cancer Cousin        paternal   Breast cancer Cousin        maternal   Diabetes Paternal Grandmother     Social History   Tobacco Use   Smoking status: Former    Current packs/day: 0.00    Average packs/day: 0.5 packs/day for 15.0 years (7.5 ttl pk-yrs)    Types: Cigarettes    Start date: 09/24/1995    Quit date: 09/23/2010    Years since quitting: 13.9   Smokeless tobacco: Never  Vaping Use   Vaping status: Every Day  Substance Use Topics   Alcohol use: Not Currently   Drug use: No    Review of Systems:   Review of Systems  Constitutional:  Negative for chills, fever, malaise/fatigue and weight loss.  HENT:  Negative for hearing loss, sinus pain and sore throat.   Respiratory:  Negative for cough and hemoptysis.   Cardiovascular:  Negative for chest pain, palpitations, leg swelling and PND.  Gastrointestinal:  Negative for abdominal pain, constipation, diarrhea, heartburn, nausea and vomiting.  Genitourinary:  Negative for dysuria, frequency and urgency.  Musculoskeletal:  Negative for back pain, myalgias and neck pain.  Skin:  Negative for itching and rash.  Neurological:  Negative for dizziness, tingling, seizures and headaches.  Endo/Heme/Allergies:  Negative for polydipsia.   Psychiatric/Behavioral:  Negative for depression. The patient is not nervous/anxious.     Objective:   BP 120/84 (BP Location: Left Arm, Patient Position: Sitting, Cuff Size: Normal)   Pulse 60   Temp (!) 97.3 F (36.3 C) (Temporal)   Ht 5' 5.5 (1.664 m)   Wt 185 lb 4 oz (84 kg)   LMP 02/15/2015 (Exact Date)   SpO2 99%   BMI 30.36 kg/m  Body mass index is 30.36 kg/m.   General Appearance:    Alert, cooperative, no distress, appears stated age  Head:    Normocephalic, without obvious abnormality, atraumatic  Eyes:    PERRL, conjunctiva/corneas clear, EOM's intact, fundi    benign, both eyes  Ears:    Normal TM's and external ear canals, both ears  Nose:   Nares normal, septum midline, mucosa normal, no drainage  or sinus tenderness  Throat:   Lips, mucosa, and tongue normal; teeth and gums normal  Neck:   Supple, symmetrical, trachea midline, no adenopathy;    thyroid :  no enlargement/tenderness/nodules; no carotid   bruit or JVD  Back:     Symmetric, no curvature, ROM normal, no CVA tenderness  Lungs:     Clear to auscultation bilaterally, respirations unlabored  Chest Wall:    No tenderness or deformity   Heart:    Regular rate and rhythm, S1 and S2 normal, no murmur, rub or gallop  Breast Exam:    Deferred  Abdomen:     Soft, non-tender, bowel sounds active all four quadrants,    no masses, no organomegaly  Genitalia:    Deferred   Extremities:   Extremities normal, atraumatic, no cyanosis or edema  Pulses:   2+ and symmetric all extremities  Skin:   Skin color, texture, turgor normal, no rashes or lesions  Lymph nodes:   Cervical, supraclavicular, and axillary nodes normal  Neurologic:   CNII-XII intact, normal strength, sensation and reflexes    throughout    Assessment/Plan:   Assessment and Plan Assessment & Plan Comprehensive Physical Exam (CPE) preventive care annual visit Today patient counseled on age appropriate routine health concerns for screening  and prevention, each reviewed and up to date or declined. Immunizations reviewed and up to date or declined. Labs ordered and reviewed. Risk factors for depression reviewed and negative. Hearing function and visual acuity are intact. ADLs screened and addressed as needed. Functional ability and level of safety reviewed and appropriate. Education, counseling and referrals performed based on assessed risks today. Patient provided with a copy of personalized plan for preventive services.  Bilateral hip pain Hip pain worsening, differential includes bursitis, arthritis,  among others Family history of osteoporosis and arthritis noted. - Refer to sports medicine for evaluation and possible imaging. - Consider bone density scan.  Hormone replacement therapy Menopausal symptoms persist despite hormone therapy. Joint pain may be related to menopause. - Check vitamin D  levels. - Continue hormone replacement therapy as adjusted by gynecologist.  Vitamin D  deficiency Potential vitamin D  deficiency related to menopausal symptoms and joint pain. - Check vitamin D  levels.  Obesity Obesity discussed in context of lifestyle, diet, and exercise. Limited exercise due to work schedule and low energy. - Encourage healthy eating and regular exercise.  Attention-deficit hyperactivity disorder ADHD managed with Vyvanse  and Effexor . Patient hesitant to increase Effexor  dosage. - Continue current ADHD medication regimen.       Lucie Buttner, PA-C [facility]

## 2024-09-04 NOTE — Patient Instructions (Signed)
 It was great to see you!  A referral has been placed for you to see one of our fantastic providers at [facility]. Someone from their office will be in touch soon regarding scheduling your appointment.  Their location:  [facility]  [address] on the 1st floor Phone number [phone] Fax [phone].   This location is across the street from the entrance to [employer] [facility] and in the same complex as the [facility]   Take care,  Jarold Motto PA-C

## 2024-09-07 ENCOUNTER — Ambulatory Visit: Payer: Self-pay | Admitting: Physician Assistant

## 2024-09-08 ENCOUNTER — Encounter: Payer: Self-pay | Admitting: Physician Assistant

## 2024-09-09 ENCOUNTER — Encounter: Payer: Self-pay | Admitting: Obstetrics and Gynecology

## 2025-09-13 ENCOUNTER — Encounter: Admitting: Physician Assistant
